# Patient Record
Sex: Female | Born: 1965 | Race: Black or African American | Hispanic: No | Smoking: Never smoker
Health system: Southern US, Community
[De-identification: ages and names within clinical notes are randomized; demographics above are authoritative.]

## PROBLEM LIST (undated history)

## (undated) DIAGNOSIS — O009 Unspecified ectopic pregnancy without intrauterine pregnancy: Secondary | ICD-10-CM

## (undated) DIAGNOSIS — D219 Benign neoplasm of connective and other soft tissue, unspecified: Secondary | ICD-10-CM

## (undated) DIAGNOSIS — D6800 Von Willebrand disease, unspecified: Secondary | ICD-10-CM

## (undated) DIAGNOSIS — N6011 Diffuse cystic mastopathy of right breast: Secondary | ICD-10-CM

## (undated) DIAGNOSIS — N6012 Diffuse cystic mastopathy of left breast: Secondary | ICD-10-CM

## (undated) DIAGNOSIS — Z8742 Personal history of other diseases of the female genital tract: Secondary | ICD-10-CM

## (undated) HISTORY — PX: ECTOPIC PREGNANCY SURGERY: SHX613

## (undated) HISTORY — DX: Diffuse cystic mastopathy of right breast: N60.11

## (undated) HISTORY — DX: Diffuse cystic mastopathy of left breast: N60.12

## (undated) HISTORY — PX: OVARIAN CYST REMOVAL: SHX89

## (undated) HISTORY — PX: COLPOSCOPY: SHX161

## (undated) HISTORY — DX: Personal history of other diseases of the female genital tract: Z87.42

## (undated) HISTORY — DX: Unspecified ectopic pregnancy without intrauterine pregnancy: O00.90

## (undated) HISTORY — DX: Benign neoplasm of connective and other soft tissue, unspecified: D21.9

---

## 1992-05-20 HISTORY — PX: SALPINGECTOMY: SHX328

## 1995-01-24 HISTORY — PX: CERVICAL BIOPSY  W/ LOOP ELECTRODE EXCISION: SUR135

## 1999-02-25 HISTORY — PX: OOPHORECTOMY: SHX86

## 2005-09-24 ENCOUNTER — Ambulatory Visit: Payer: Self-pay | Admitting: Unknown Physician Specialty

## 2006-12-17 ENCOUNTER — Ambulatory Visit: Payer: Self-pay | Admitting: Unknown Physician Specialty

## 2008-01-30 ENCOUNTER — Ambulatory Visit: Payer: Self-pay | Admitting: Unknown Physician Specialty

## 2009-01-31 ENCOUNTER — Ambulatory Visit: Payer: Self-pay | Admitting: Unknown Physician Specialty

## 2009-02-18 ENCOUNTER — Ambulatory Visit: Payer: Self-pay | Admitting: Unknown Physician Specialty

## 2009-08-06 ENCOUNTER — Emergency Department: Payer: Self-pay | Admitting: Emergency Medicine

## 2009-12-22 ENCOUNTER — Ambulatory Visit: Payer: Self-pay

## 2010-04-17 ENCOUNTER — Ambulatory Visit: Payer: Self-pay | Admitting: Unknown Physician Specialty

## 2011-04-21 ENCOUNTER — Ambulatory Visit: Payer: Self-pay | Admitting: Unknown Physician Specialty

## 2014-03-21 ENCOUNTER — Ambulatory Visit: Payer: Self-pay

## 2014-06-10 ENCOUNTER — Emergency Department: Payer: Self-pay | Admitting: Emergency Medicine

## 2014-06-10 LAB — WET PREP, GENITAL

## 2015-09-17 ENCOUNTER — Emergency Department
Admission: EM | Admit: 2015-09-17 | Discharge: 2015-09-17 | Disposition: A | Discharge status: Home / Self Care | Payer: Self-pay | Attending: Emergency Medicine | Admitting: Emergency Medicine

## 2015-09-17 ENCOUNTER — Encounter: Payer: Self-pay | Admitting: Emergency Medicine

## 2015-09-17 DIAGNOSIS — N3001 Acute cystitis with hematuria: Secondary | ICD-10-CM | POA: Insufficient documentation

## 2015-09-17 DIAGNOSIS — Z3202 Encounter for pregnancy test, result negative: Secondary | ICD-10-CM | POA: Insufficient documentation

## 2015-09-17 HISTORY — DX: Von Willebrand disease, unspecified: D68.00

## 2015-09-17 HISTORY — DX: Von Willebrand's disease: D68.0

## 2015-09-17 LAB — POCT PREGNANCY, URINE: PREG TEST UR: NEGATIVE

## 2015-09-17 LAB — URINALYSIS COMPLETE WITH MICROSCOPIC (ARMC ONLY)
BILIRUBIN URINE: NEGATIVE
Bacteria, UA: NONE SEEN
Glucose, UA: NEGATIVE mg/dL
KETONES UR: NEGATIVE mg/dL
Nitrite: NEGATIVE
PH: 6 (ref 5.0–8.0)
PROTEIN: NEGATIVE mg/dL
Specific Gravity, Urine: 1.02 (ref 1.005–1.030)

## 2015-09-17 LAB — PREGNANCY, URINE: Preg Test, Ur: NEGATIVE

## 2015-09-17 MED ORDER — CIPROFLOXACIN HCL 500 MG PO TABS: 500.0000 mg | ORAL_TABLET | Freq: Two times a day (BID) | ORAL | Status: AC

## 2015-09-17 MED ORDER — FLUCONAZOLE 150 MG PO TABS: 150.0000 mg | ORAL_TABLET | Freq: Every day | ORAL | Status: DC

## 2015-09-17 NOTE — Discharge Instructions (Signed)

## 2015-09-17 NOTE — ED Provider Notes (Signed)
CSN: [id number]     Arrival date & time 09/17/15  1429 History   First MD Initiated Contact with Patient 09/17/15 1443     Chief Complaint  Patient presents with  . Urinary Frequency    HPI Comments: 49 year old female presents today complaining of urinary pressure and frequency. She has already finished a course of macrobid and septra for the same complaint. Seen at urgent care twice, finished last antibiotic approximately a week ago. Continues to have urinary frequency, no burning lower abdominal or back pain. Has had some intermittent vaginal itching, no discharge increased from baseline. LNMP was 1 month ago, sexually active had admits to missed contraceptive doses.    Past Medical History  Diagnosis Date  . Von Willebrand disease ([place])    History reviewed. No pertinent past surgical history. No family history on file. Social History  Substance Use Topics  . Smoking status: Never Smoker   . Smokeless tobacco: None  . Alcohol Use: Yes   OB History    No data available     Review of Systems  Constitutional: Negative for fever and chills.  Gastrointestinal: Negative for abdominal pain.  Endocrine: Positive for polyuria.  Genitourinary: Positive for vaginal discharge. Negative for dysuria, decreased urine volume, difficulty urinating and pelvic pain.  Skin: Negative for rash.  All other systems reviewed and are negative.     Allergies  Review of patient's allergies indicates no known allergies.  Home Medications   Prior to Admission medications   Medication Sig Start Date End Date Taking? Authorizing Provider  fluconazole (DIFLUCAN) 150 MG tablet Take 1 tablet (150 mg total) by mouth daily. 09/17/15   Pierce Crane Beers, PA-C   BP 154/73 mmHg  Pulse 83  Temp(Src) 98.1 F (36.7 C) (Oral)  Resp 18  Ht 5\' 5"  (1.651 m)  Wt 136 lb (61.689 kg)  BMI 22.63 kg/m2  SpO2 98%  LMP 08/22/2015 (Exact Date) Physical Exam  Constitutional: She is oriented to person, place, and  time. Vital signs are normal. She appears well-developed and well-nourished. She is active.  Non-toxic appearance. She does not have a sickly appearance. She does not appear ill.  HENT:  Head: Normocephalic and atraumatic.  Cardiovascular: Normal rate, regular rhythm, normal heart sounds and intact distal pulses.  Exam reveals no gallop and no friction rub.   No murmur heard. Pulmonary/Chest: Effort normal and breath sounds normal. No respiratory distress. She has no wheezes. She has no rales.  Abdominal: Soft. Bowel sounds are normal. She exhibits no distension. There is no tenderness. There is no rebound and no guarding.  No CVA tenderness.   Musculoskeletal: Normal range of motion.  Neurological: She is alert and oriented to person, place, and time.  Skin: Skin is warm and dry.  Psychiatric: She has a normal mood and affect. Her behavior is normal. Judgment and thought content normal.  Nursing note and vitals reviewed.   ED Course  Procedures (including critical care time) Labs Review Labs Reviewed  URINALYSIS COMPLETEWITH MICROSCOPIC ([place]) - Abnormal; Notable for the following:    Color, Urine YELLOW (*)    APPearance CLEAR (*)    Hgb urine dipstick 1+ (*)    Leukocytes, UA 2+ (*)    Squamous Epithelial / LPF 6-30 (*)    All other components within normal limits  URINE CULTURE  PREGNANCY, URINE  POCT PREGNANCY, URINE    Imaging Review No results found. I have personally reviewed and evaluated these images and  lab results as part of my medical decision-making.   EKG Interpretation None      MDM  UA pending at this time - will turn over care to [facility], PA-C. Plan is to treat with abx or refer back to PCP for OAB  Final diagnoses:  Acute cystitis with hematuria        Corliss Parish, PA-C 09/28/15 1018  Eula Listen, MD 10/01/15 1456

## 2015-09-17 NOTE — ED Notes (Signed)
States she just finished 2 rounds of antibiotics for uti  Still feels urinary pressure  With some freq.

## 2015-09-17 NOTE — ED Notes (Signed)
States she was placed on macrobid and then septra ds for UTI .finished both of those meds. Now having some slight vaginal itching  And some pressure with urination

## 2015-09-17 NOTE — ED Provider Notes (Signed)
  Physical Exam  BP 154/73 mmHg  Pulse 83  Temp(Src) 98.1 F (36.7 C) (Oral)  Resp 18  Ht 5\' 5"  (1.651 m)  Wt 136 lb (61.689 kg)  BMI 22.63 kg/m2  SpO2 98%  LMP 08/22/2015 (Exact Date)  Physical Exam  ED Course  Procedures  MDM 2 urinary tract infection. Rx given for Cipro 500 mg twice a day #20. Patient follow-up with PCP or return to the emergency room for reevaluation. Rx given for Diflucan 150 by mouth 1 with an ovarian antibiotic course.  Patient voices no other emergency medical complaints at this time.      Arlyss Repress, PA-C 09/17/15 [place], MD 09/17/15 2051

## 2015-09-19 LAB — URINE CULTURE: Special Requests: NORMAL

## 2015-10-01 ENCOUNTER — Encounter: Payer: Self-pay | Admitting: Emergency Medicine

## 2015-10-01 ENCOUNTER — Emergency Department
Admission: EM | Admit: 2015-10-01 | Discharge: 2015-10-01 | Disposition: A | Discharge status: Home / Self Care | Payer: Self-pay | Attending: Emergency Medicine | Admitting: Emergency Medicine

## 2015-10-01 DIAGNOSIS — Z79899 Other long term (current) drug therapy: Secondary | ICD-10-CM | POA: Insufficient documentation

## 2015-10-01 DIAGNOSIS — N3090 Cystitis, unspecified without hematuria: Secondary | ICD-10-CM | POA: Insufficient documentation

## 2015-10-01 LAB — URINALYSIS COMPLETE WITH MICROSCOPIC (ARMC ONLY)
BACTERIA UA: NONE SEEN
Bilirubin Urine: NEGATIVE
GLUCOSE, UA: NEGATIVE mg/dL
HGB URINE DIPSTICK: NEGATIVE
Nitrite: NEGATIVE
Protein, ur: NEGATIVE mg/dL
SPECIFIC GRAVITY, URINE: 1.025 (ref 1.005–1.030)
pH: 6 (ref 5.0–8.0)

## 2015-10-01 NOTE — ED Provider Notes (Signed)
[facility] Emergency Department Provider Note  ____________________________________________  Time seen: On arrival  I have reviewed the triage vital signs and the nursing notes.   HISTORY  Chief Complaint Urinary Frequency    HPI Erin Davidson is a 49 y.o. female who presents with complaints of urinary frequency/pressure. She notes this started yesterday. She reports she has been treated twice in the past month for urinary tract infection and the symptoms were similar and she did feel better for sometime but now symptoms returned. She is frustrated and does not understand why this happened. No history of urinary tract infections prior to this. No vaginal discharge    Past Medical History  Diagnosis Date  . Von Willebrand disease ([place])     There are no active problems to display for this patient.   Past Surgical History  Procedure Laterality Date  . Ectopic pregnancy surgery    . Ovarian cyst surgery      Current Outpatient Rx  Name  Route  Sig  Dispense  Refill  . fluconazole (DIFLUCAN) 150 MG tablet   Oral   Take 1 tablet (150 mg total) by mouth daily.   1 tablet   0     Allergies Aspirin  No family history on file.  Social History Social History  Substance Use Topics  . Smoking status: Never Smoker   . Smokeless tobacco: None  . Alcohol Use: Yes    Review of Systems  Constitutional: Negative for fever. Eyes: Negative for discharge ENT: Negative for sore throat Genitourinary: Negative for dysuria. Positive for pressure Musculoskeletal: Negative for back pain. Skin: Negative for rash.    ____________________________________________   PHYSICAL EXAM:  VITAL SIGNS: ED Triage Vitals  Enc Vitals Group     BP 10/01/15 1150 130/77 mmHg     Pulse Rate 10/01/15 1150 71     Resp 10/01/15 1150 16     Temp 10/01/15 1150 97.9 F (36.6 C)     Temp Source 10/01/15 1150 Oral     SpO2 10/01/15 1150 100 %     Weight 10/01/15  1150 136 lb (61.689 kg)     Height 10/01/15 1150 5\' 5"  (1.651 m)     Head Cir --      Peak Flow --      Pain Score 10/01/15 1151 2     Pain Loc --      Pain Edu? --      Excl. in [place]? --      Constitutional: Alert and oriented. Well appearing and in no distress. Eyes: Conjunctivae are normal.  ENT   Head: Normocephalic and atraumatic.   Mouth/Throat: Mucous membranes are moist. Cardiovascular: Normal rate, regular rhythm.  Respiratory: Normal respiratory effort without tachypnea nor retractions.  Gastrointestinal: Soft and non-tender in all quadrants. No distention. There is no CVA tenderness. Musculoskeletal: Nontender with normal range of motion in all extremities. Neurologic:  Normal speech and language. No gross focal neurologic deficits are appreciated. Skin:  Skin is warm, dry and intact. No rash noted. Psychiatric: Mood and affect are normal. Patient exhibits appropriate insight and judgment.  ____________________________________________    LABS (pertinent positives/negatives)  Labs Reviewed  URINALYSIS COMPLETEWITH MICROSCOPIC ([facility] ONLY) - Abnormal; Notable for the following:    Color, Urine YELLOW (*)    APPearance CLEAR (*)    Ketones, ur TRACE (*)    Leukocytes, UA TRACE (*)    Squamous Epithelial / LPF 0-5 (*)    All other  components within normal limits  URINE CULTURE    ____________________________________________     ____________________________________________    RADIOLOGY I have personally reviewed any xrays that were ordered on this patient: None  ____________________________________________   PROCEDURES  Procedure(s) performed: none   ____________________________________________   INITIAL IMPRESSION / ASSESSMENT AND PLAN / ED COURSE  Pertinent labs & imaging results that were available during my care of the patient were reviewed by me and considered in my medical decision making (see chart for details).  Urinalysis is  unremarkable. Patient does report a history of fibroids and I'm suspicious that they have grown to a size where they are affecting her bladder. I will send a urine culture but will not treat with antibiotics at this time. The patient will follow-up with her gynecologist as soon as possible to evaluate for enlargement of fibroids  ____________________________________________   FINAL CLINICAL IMPRESSION(S) / ED DIAGNOSES  Final diagnoses:  Cystitis     Lavonia Drafts, MD 10/01/15 1408

## 2015-10-01 NOTE — ED Notes (Signed)
Pt reports urinary frequency and left lower back/flank pain since yesterday. Pt reports she just finished a course of antibiotics with no relief from symptoms.

## 2015-10-05 LAB — URINE CULTURE

## 2015-10-06 ENCOUNTER — Telehealth: Payer: Self-pay | Admitting: Pharmacist

## 2015-10-06 NOTE — Telephone Encounter (Signed)
Pt called and informed that culture was postive for UTI and offered to call in RX to pharmacy. Pt requested [facility] in [place]. Pen VK 500mg  po bid for 10 days was called in. Authorized by Dr. Conni Slipper. Message left on provider VM as pharmacy was closed.   Ramond Dial, Pharm.D Clinical Pharmacist

## 2016-03-16 ENCOUNTER — Other Ambulatory Visit: Payer: Self-pay | Admitting: Certified Nurse Midwife

## 2016-03-16 DIAGNOSIS — N63 Unspecified lump in unspecified breast: Secondary | ICD-10-CM

## 2016-03-30 ENCOUNTER — Ambulatory Visit
Admission: RE | Admit: 2016-03-30 | Discharge: 2016-03-30 | Disposition: A | Discharge status: Home / Self Care | Payer: 59 | Source: Ambulatory Visit | Attending: Certified Nurse Midwife | Admitting: Certified Nurse Midwife

## 2016-03-30 DIAGNOSIS — N63 Unspecified lump in unspecified breast: Secondary | ICD-10-CM

## 2017-03-17 ENCOUNTER — Encounter: Payer: Self-pay | Admitting: Certified Nurse Midwife

## 2017-03-17 ENCOUNTER — Ambulatory Visit (INDEPENDENT_AMBULATORY_CARE_PROVIDER_SITE_OTHER): Payer: 59 | Admitting: Certified Nurse Midwife

## 2017-03-17 VITALS — BP 120/80 | HR 80 | Ht 65.0 in | Wt 144.0 lb

## 2017-03-17 DIAGNOSIS — D251 Intramural leiomyoma of uterus: Secondary | ICD-10-CM | POA: Diagnosis not present

## 2017-03-17 DIAGNOSIS — Z1231 Encounter for screening mammogram for malignant neoplasm of breast: Secondary | ICD-10-CM

## 2017-03-17 DIAGNOSIS — N6310 Unspecified lump in the right breast, unspecified quadrant: Secondary | ICD-10-CM

## 2017-03-17 DIAGNOSIS — Z1239 Encounter for other screening for malignant neoplasm of breast: Secondary | ICD-10-CM

## 2017-03-17 DIAGNOSIS — N6320 Unspecified lump in the left breast, unspecified quadrant: Secondary | ICD-10-CM | POA: Diagnosis not present

## 2017-03-17 DIAGNOSIS — Z01419 Encounter for gynecological examination (general) (routine) without abnormal findings: Secondary | ICD-10-CM | POA: Diagnosis not present

## 2017-03-17 DIAGNOSIS — Z124 Encounter for screening for malignant neoplasm of cervix: Secondary | ICD-10-CM | POA: Diagnosis not present

## 2017-03-17 MED ORDER — JUNEL FE 1/20 1-20 MG-MCG PO TABS: 1.0000 | ORAL_TABLET | Freq: Every day | ORAL | 11 refills | Status: DC

## 2017-03-20 ENCOUNTER — Encounter: Payer: Self-pay | Admitting: Certified Nurse Midwife

## 2017-03-20 NOTE — Progress Notes (Addendum)
Gynecology Annual Exam  PCP: No PCP Per Patient  Chief Complaint:  Chief Complaint  Patient presents with  . Gynecologic Exam    History of Present Illness: Patient is a 51 y.o. [id number] BF, LMP 03/03/2017, who presents for annual exam. The patient has no complaints today.  Her menses are monthly on her birth control pills. They last 7 days and are a light flow. Her past medical history is remarkable for a large fibroid uterus and Von Willebrand disease. She has had two ectopic pregnancies and has had a partial left salpingectmy and a left oophorectomy. She also has a remote history of abnormal Pap smears and has had a LEEP in 1996. Her last Pap smear in 03/16/16 was NIL/ neg HRHPV. The patient is sexually active. She currently uses Oral contraceptives: Junel 1/20 for contraception.   The patient does perform self breast exams. She has a history of fibrocystic breasts and her last mammogram was diagnostic for palpable bilateral breast masses. It revealed two cysts: one at 10 o'clock in the right breast and another at 5 o'clock in the left breast. She is still able to palpate the same mass in the right breast. There is no notable family history of breast or ovarian cancer in her family. THe patient does not smoke. She drinks alcohol occasionally. She denies use of illicit street drugs.  The patient has regular exercise: yes.  She walks 1 mile 4x/week. She gets adequate calcium in her diet. Her routine screen fro cholesterol was normal in 2017. Marland Kitchen    Review of Systems: Review of Systems  Constitutional: Negative for chills, fever and weight loss.  HENT: Negative for congestion, sinus pain and sore throat.   Eyes: Negative for blurred vision and pain.  Respiratory: Negative for hemoptysis, shortness of breath and wheezing.   Cardiovascular: Negative for chest pain, palpitations and leg swelling.  Gastrointestinal: Negative for abdominal pain, blood in stool, diarrhea, heartburn, nausea and  vomiting.  Genitourinary: Negative for dysuria, frequency, hematuria and urgency.  Musculoskeletal: Negative for back pain, joint pain and myalgias.  Skin: Negative for itching and rash.  Neurological: Negative for dizziness, tingling and headaches.  Endo/Heme/Allergies: Negative for environmental allergies and polydipsia. Does not bruise/bleed easily.       Negative for hirsutism   Psychiatric/Behavioral: Negative for depression. The patient is not nervous/anxious and does not have insomnia.   Breasts: positive for breast mass in right breast  Past Medical History:  Past Medical History:  Diagnosis Date  . Ectopic pregnancy 05/20/92; 02/25/99  . Fibrocystic breast changes of both breasts   . Fibroid   . History of abnormal cervical Pap smear    HGSIL  . Von Willebrand disease ([place])     Past Surgical History:  Past Surgical History:  Procedure Laterality Date  . CERVICAL BIOPSY  W/ LOOP ELECTRODE EXCISION  01/24/1995   HSIL  . COLPOSCOPY    . ECTOPIC PREGNANCY SURGERY    . OOPHORECTOMY  02/25/1999   left ovary/tube preop ruptured ectopic  . OVARIAN CYST REMOVAL Left   . SALPINGECTOMY  05/20/1992   left for partial ruptured ectopic    Gynecologic History:  Patient's last menstrual period was 03/03/2017 (exact date).  Family History:  Family History  Problem Relation Age of Onset  . Hyperlipidemia Mother   . Kidney cancer Mother 83  . Hyperlipidemia Maternal Aunt   . Lung cancer Maternal Grandfather   . Lung cancer Paternal Grandfather  Social History:  Social History   Social History  . Marital status: Divorced    Spouse name: N/A  . Number of children: 2  . Years of education: N/A   Occupational History  . Lab asisstant    Social History Main Topics  . Smoking status: Never Smoker  . Smokeless tobacco: Never Used  . Alcohol use Yes     Comment: occ  . Drug use: No  . Sexual activity: Yes    Birth control/ protection: Pill   Other Topics Concern    . Not on file   Social History Narrative  . No narrative on file    Allergies:  Allergies  Allergen Reactions  . Aspirin     See medical hx    Medications: Prior to Admission medications   Medication Sig Start Date End Date Taking? Authorizing Provider  JUNEL FE 1/20 1-20 MG-MCG tablet Take 1 tablet by mouth daily. 03/17/17   Dalia Heading, CNM  Vitacrave Gummy daily  Physical Exam Vitals: Blood pressure 120/80, pulse 80, height 5\' 5"  (1.651 m), weight 144 lb (65.3 kg),BMI 23.96kg/m2,  last menstrual period 03/03/2017.  General: Bwell nourished, well developed, BF, in NAD HEENT: normocephalic, anicteric Thyroid: no enlargement, no palpable nodules Pulmonary: No increased work of breathing, CTAB Cardiovascular: RRR,without murmur Breast: Breast symmetrical, no tenderness, no skin or nipple retraction present, no nipple discharge. In right breast at 10 o'clock is a 2 cm round, firm mass. In the left breast at 5 o'clock is a 1 cm, round, more subtle mass.  No axillary, infraclavicular, or supraclavicular lymphadenopathy. Abdomen:  soft, non-tender. Mass (uterine fundus just below the umbilicus) Umbilicus without lesions  No hepatomegaly.  No evidence of hernia  Genitourinary:  External: Normal external female genitalia.  Normal urethral meatus, normal Bartholin's and Skene's glands.    Vagina: Normal vaginal mucosa, no evidence of prolapse.    Cervix: Grossly normal in appearance, no bleeding  Uterus: enlarged, not mobile, palpated fundus just below her umbilicus, NT  Adnexa: unable to evaluate due to large uterus  Rectal: deferred  Lymphatic: no evidence of inguinal lymphadenopathy Extremities: no edema, erythema, or tenderness Neurologic: Grossly intact Psychiatric: mood appropriate, affect full     Assessment: 51 y.o. [id number] well woman exam Asymptomatic large fibroid uterus Bilateral breast masses- same areas where breast cysts were noted in last years diagnostic  mammogram   Plan: 1) Mammogram - will order diagnostic bilateral mammogram and ultrasound bilaterally  2) Pap done  3) Contraception - Refill of Junel 1/20 e-prescribed  4) Routine healthcare maintenance including cholesterol, diabetes screening  done at work   5) Large fibroid- stable in size, and basically asymptomatic  Dalia Heading, CNM After referral made to [facility] for diagnostic mammogram, they recommended doing screening mammogram since current masses probably the same as last year. Patient called and advised of same and she concurs with screening mammogram first with the risk of being called back for further views/ ultrasound if needed.  Dalia Heading, CNM

## 2017-03-22 LAB — IGP,RFX APTIMA HPV ALL PTH: PAP Smear Comment: 0

## 2017-03-22 NOTE — Addendum Note (Signed)
Addended by: Dalia Heading on: 03/22/2017 03:26 PM   Modules accepted: Orders

## 2017-04-09 ENCOUNTER — Ambulatory Visit
Admission: RE | Admit: 2017-04-09 | Discharge: 2017-04-09 | Disposition: A | Discharge status: Home / Self Care | Payer: 59 | Source: Ambulatory Visit | Attending: Certified Nurse Midwife | Admitting: Certified Nurse Midwife

## 2017-04-09 DIAGNOSIS — N6310 Unspecified lump in the right breast, unspecified quadrant: Secondary | ICD-10-CM

## 2017-04-09 DIAGNOSIS — Z1239 Encounter for other screening for malignant neoplasm of breast: Secondary | ICD-10-CM

## 2017-04-09 DIAGNOSIS — Z1231 Encounter for screening mammogram for malignant neoplasm of breast: Secondary | ICD-10-CM | POA: Diagnosis present

## 2017-04-09 DIAGNOSIS — R928 Other abnormal and inconclusive findings on diagnostic imaging of breast: Secondary | ICD-10-CM | POA: Diagnosis not present

## 2017-04-09 DIAGNOSIS — N6320 Unspecified lump in the left breast, unspecified quadrant: Secondary | ICD-10-CM

## 2017-04-12 ENCOUNTER — Other Ambulatory Visit: Payer: Self-pay | Admitting: Certified Nurse Midwife

## 2017-04-12 DIAGNOSIS — N6310 Unspecified lump in the right breast, unspecified quadrant: Secondary | ICD-10-CM

## 2017-04-12 DIAGNOSIS — R928 Other abnormal and inconclusive findings on diagnostic imaging of breast: Secondary | ICD-10-CM

## 2017-04-16 ENCOUNTER — Telehealth: Payer: Self-pay | Admitting: Certified Nurse Midwife

## 2017-04-16 NOTE — Telephone Encounter (Signed)
Patient with history of fibrocystic breasts called with mammogram results-needs additional views on the right breast for possible mass.. Will order and [place] will contact for appt

## 2017-04-23 ENCOUNTER — Ambulatory Visit
Admission: RE | Admit: 2017-04-23 | Discharge: 2017-04-23 | Disposition: A | Discharge status: Home / Self Care | Payer: 59 | Source: Ambulatory Visit | Attending: Certified Nurse Midwife | Admitting: Certified Nurse Midwife

## 2017-04-23 DIAGNOSIS — N6310 Unspecified lump in the right breast, unspecified quadrant: Secondary | ICD-10-CM

## 2017-04-23 DIAGNOSIS — N6001 Solitary cyst of right breast: Secondary | ICD-10-CM | POA: Insufficient documentation

## 2017-04-23 DIAGNOSIS — R928 Other abnormal and inconclusive findings on diagnostic imaging of breast: Secondary | ICD-10-CM

## 2018-01-12 IMAGING — US US BREAST*R* LIMITED INC AXILLA
1 series · 6 of 6 positions shown · non-contrast
Comparison: Previous exam(s).

CLINICAL DATA: Possible mass in the far posterior aspect of the
upper right breast on a recent 2D screening mammogram.

EXAM:
2D DIGITAL DIAGNOSTIC RIGHT MAMMOGRAM WITH CAD AND ADJUNCT TOMO
ULTRASOUND RIGHT BREAST

[Series 1: us breast*right* limited inc axilla · 0.06mm/px · 6 of 6 slices shown]
[im 1/6]
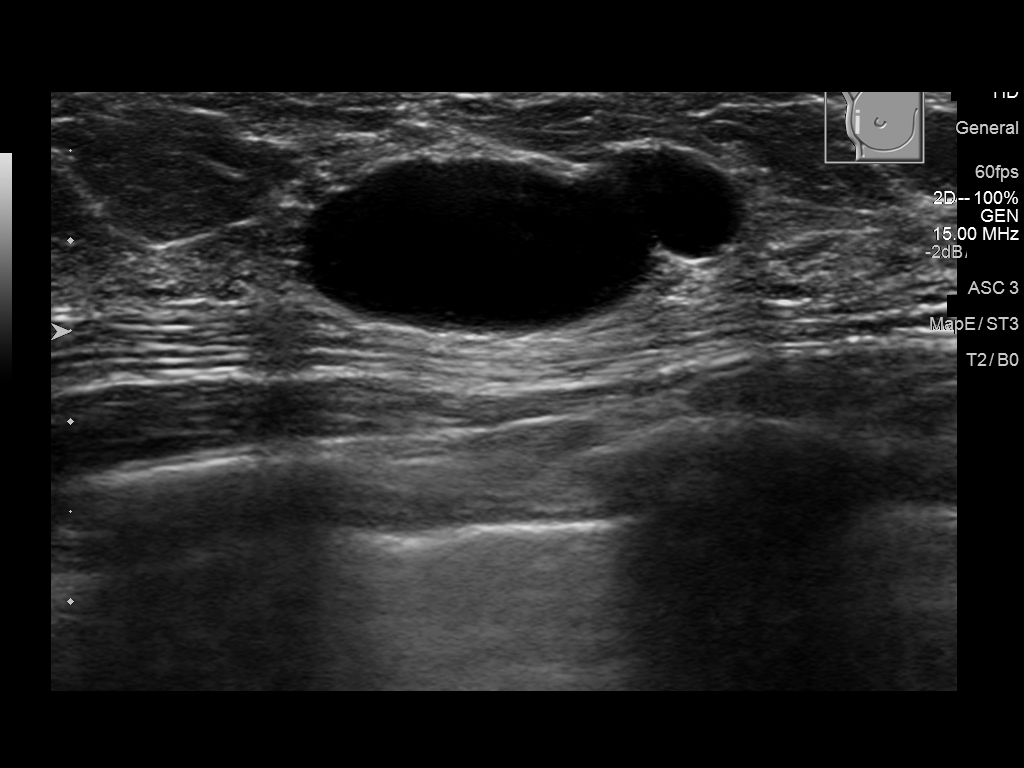
[im 2/6]
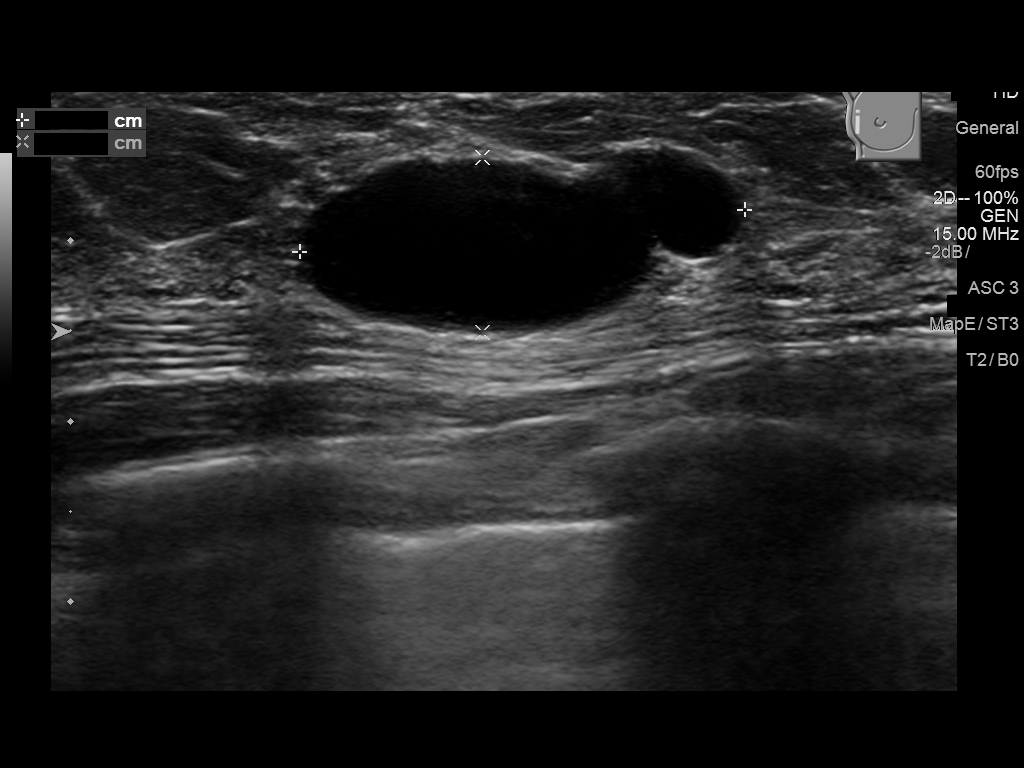
[im 3/6]
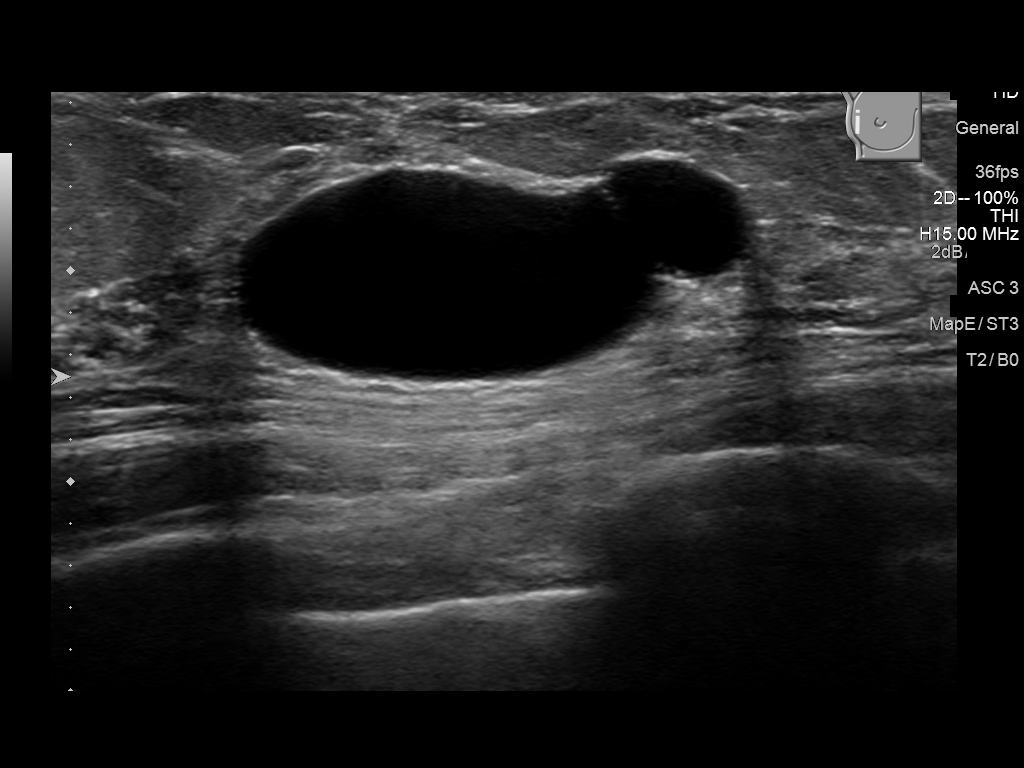
[im 4/6]
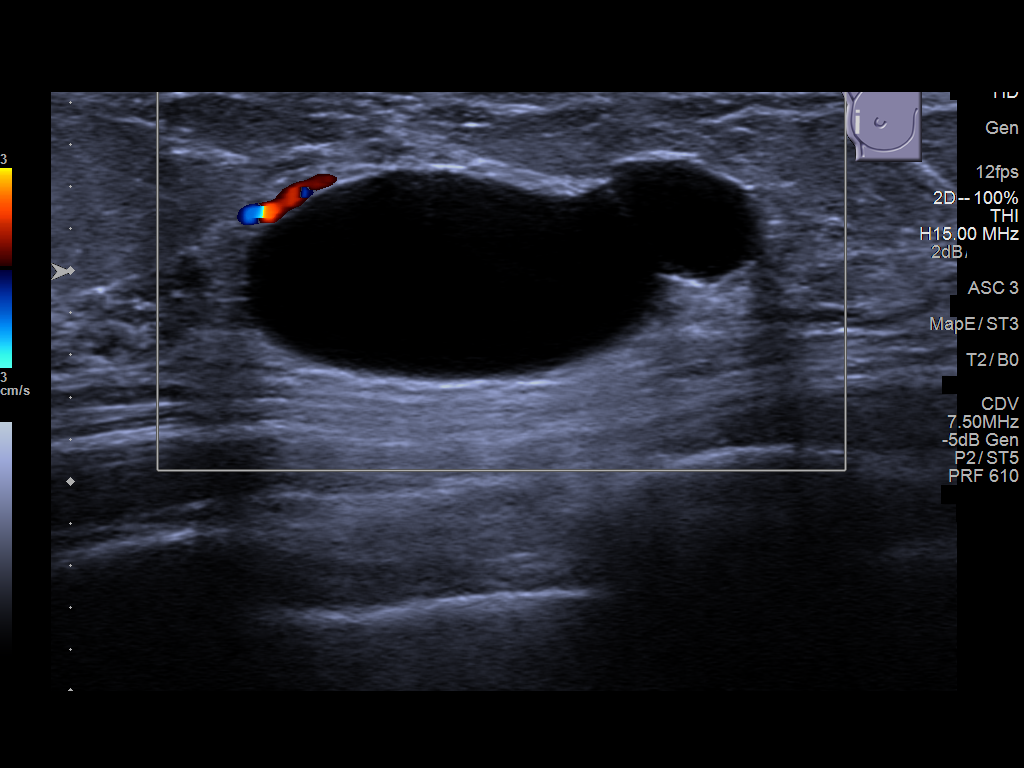
[im 5/6]
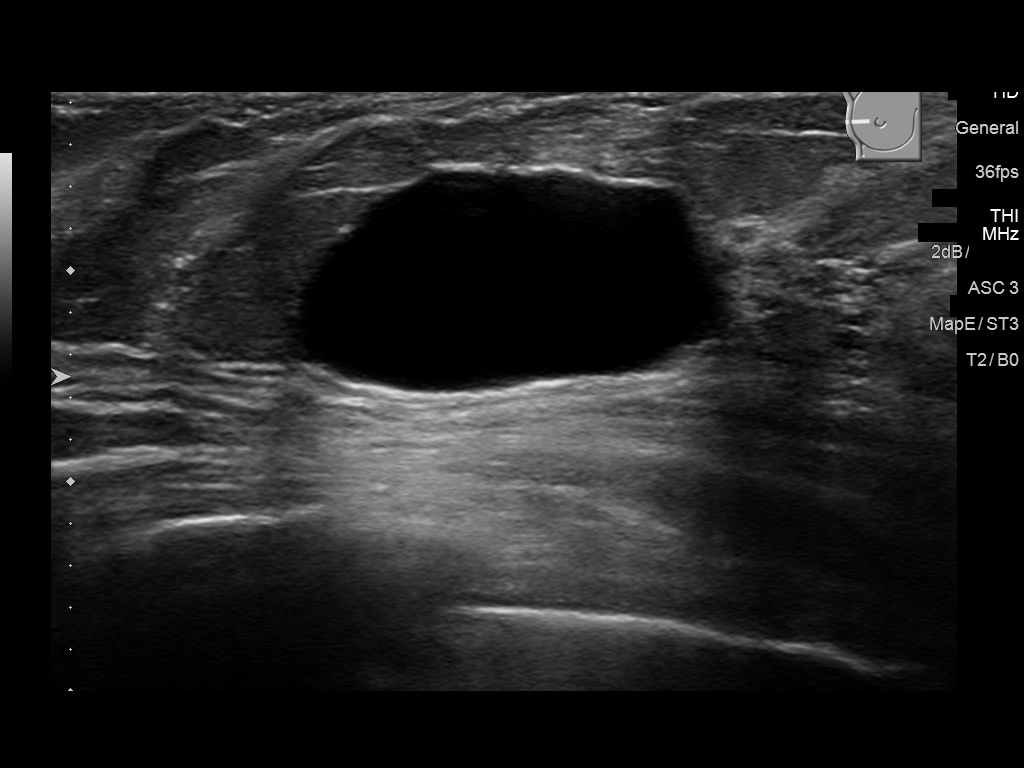
[im 6/6]
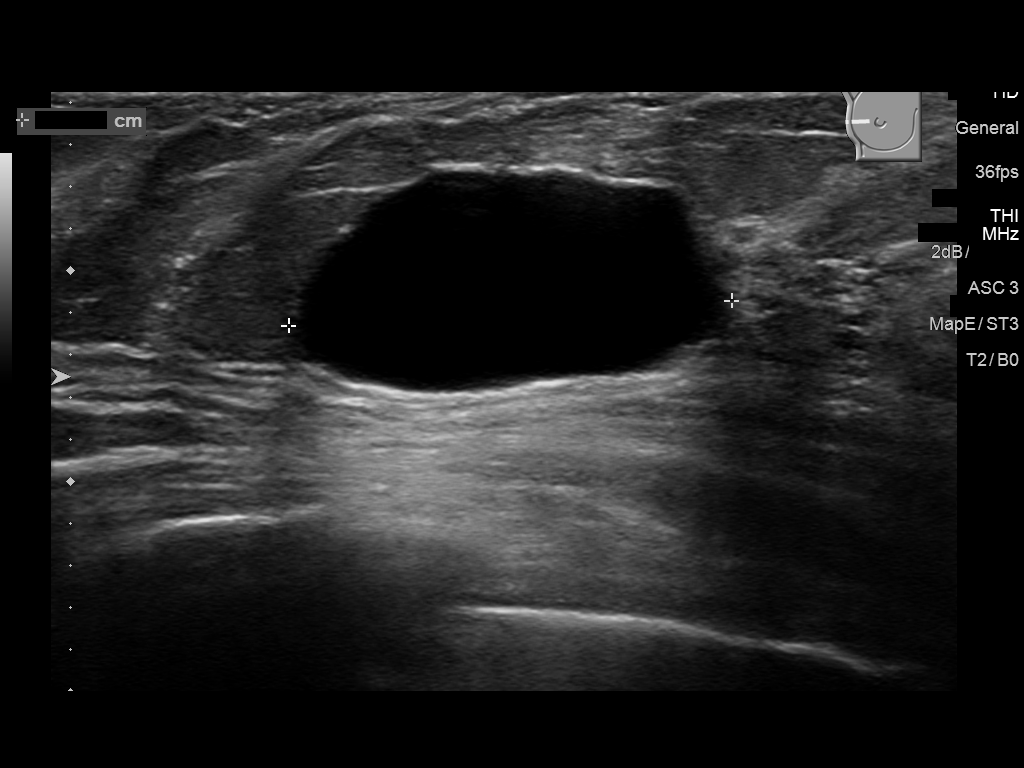

[6 of 6 positions shown; findings below may reference images not displayed]

ACR Breast Density Category d: The breast tissue is extremely dense,
which lowers the sensitivity of mammography.
FINDINGS: 2D and 3D tomographic images of the right breast confirm a 2 cm
rounded, circumscribed and partially obscured mass deep in the
superior aspect of the right breast in the oblique projection. This
is far laterally located on laterally exaggerated craniocaudal
views.

Mammographic images were processed with CAD.

On physical exam, no mass is palpable in the outer right breast in
the area of mammographic concern.

Targeted ultrasound is performed, showing a 2.5 cm cyst containing a
thin partial internal septation in the 9 o'clock position of the
right breast, 7 cm from the nipple, corresponding to the
mammographic mass. Additional smaller cysts were seen more medially
in that portion of the breast at real-time.
IMPRESSION: The recently suspected right breast mass is a benign cyst. No
evidence of malignancy.

RECOMMENDATION:
Bilateral screening mammogram in 1 year.

I have discussed the findings and recommendations with the patient.
Results were also provided in writing at the conclusion of the
visit. If applicable, a reminder letter will be sent to the patient
regarding the next appointment.

BI-RADS CATEGORY  2: Benign.

## 2018-02-15 IMAGING — MG MM DIGITAL DIAGNOSTIC UNILAT*R* W/ TOMO W/ CAD
8 of 10 series · 8 of 18 positions shown · non-contrast
Comparison: Previous exam(s).

CLINICAL DATA: Possible mass in the far posterior aspect of the
upper right breast on a recent 2D screening mammogram.

EXAM:
2D DIGITAL DIAGNOSTIC RIGHT MAMMOGRAM WITH CAD AND ADJUNCT TOMO
ULTRASOUND RIGHT BREAST

[R MLO (1 of 3)]
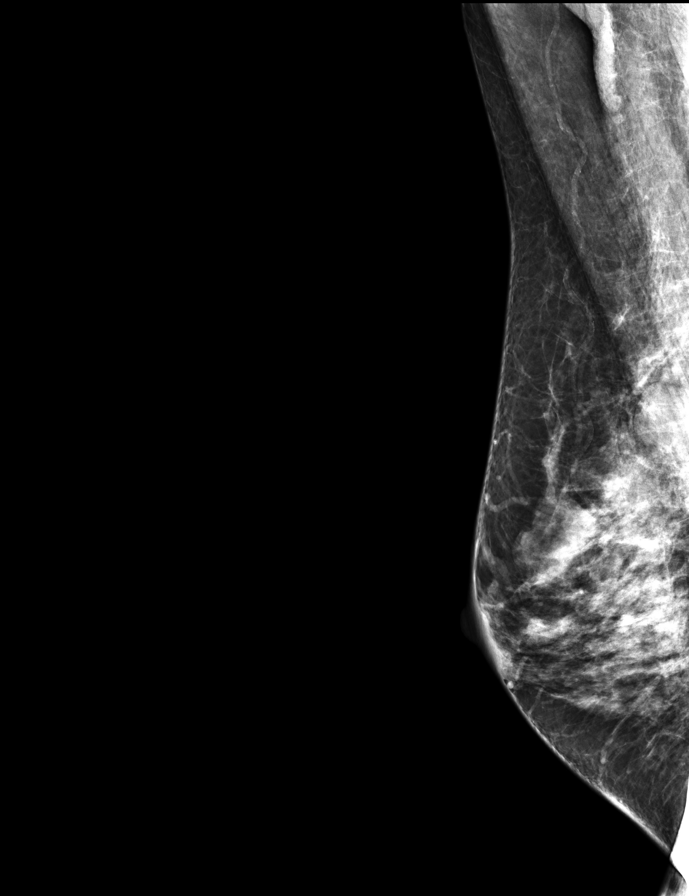

[R MLO synth-2D]
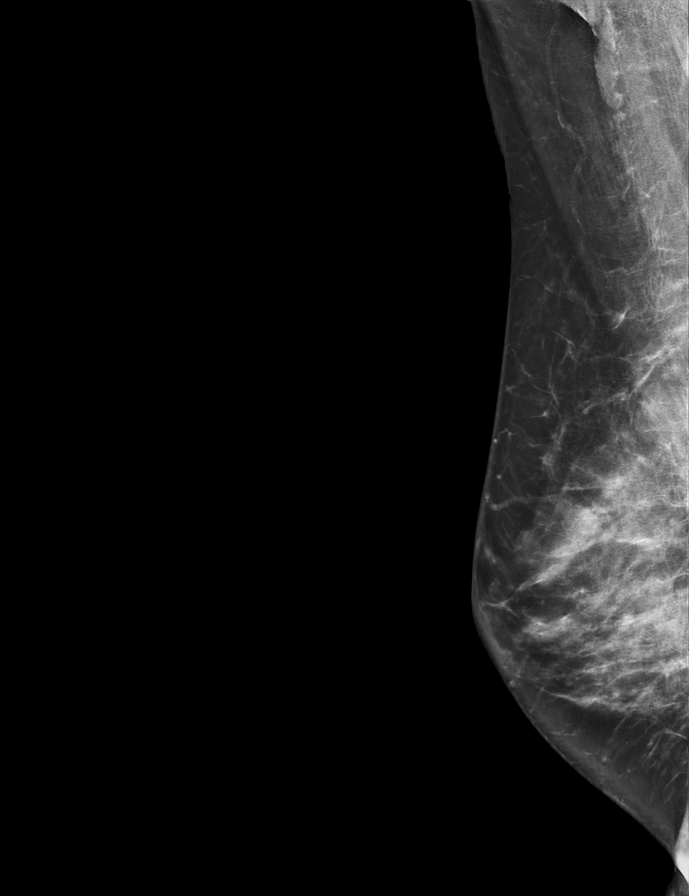

[R XCCL synth-2D]
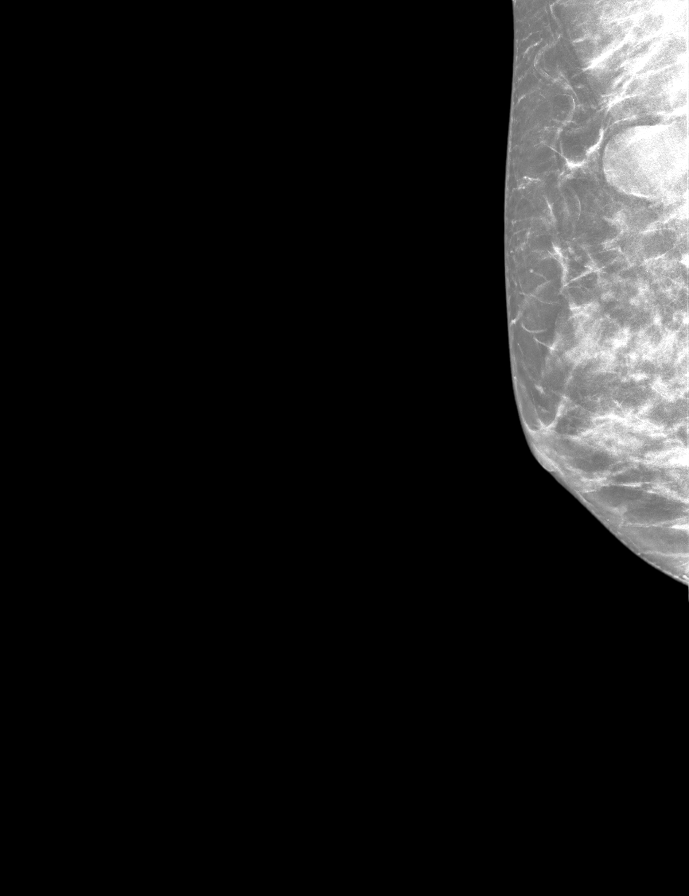

[R XCCL (1 of 3)]
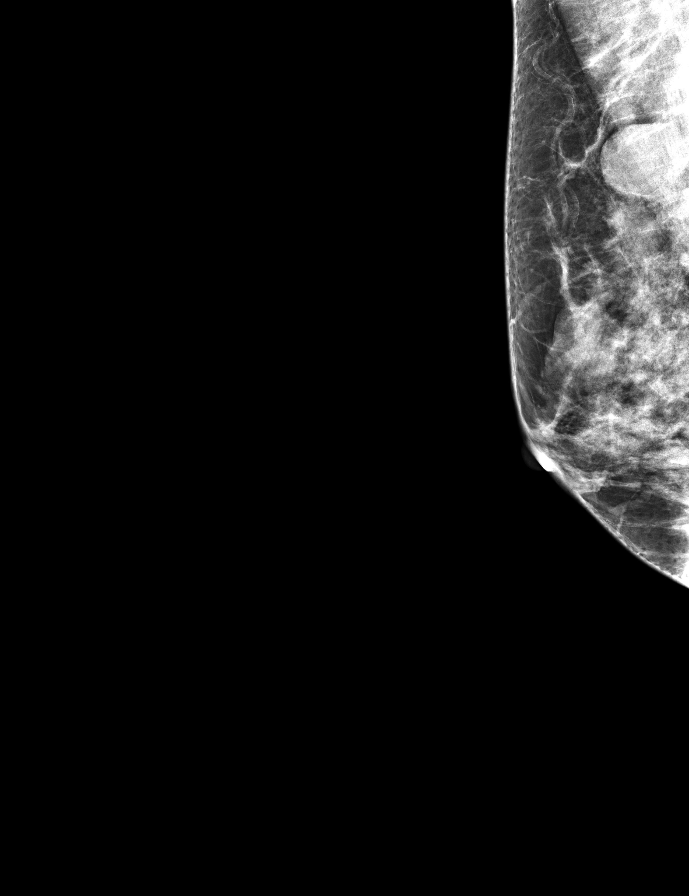

[R MLO (2 of 3)]
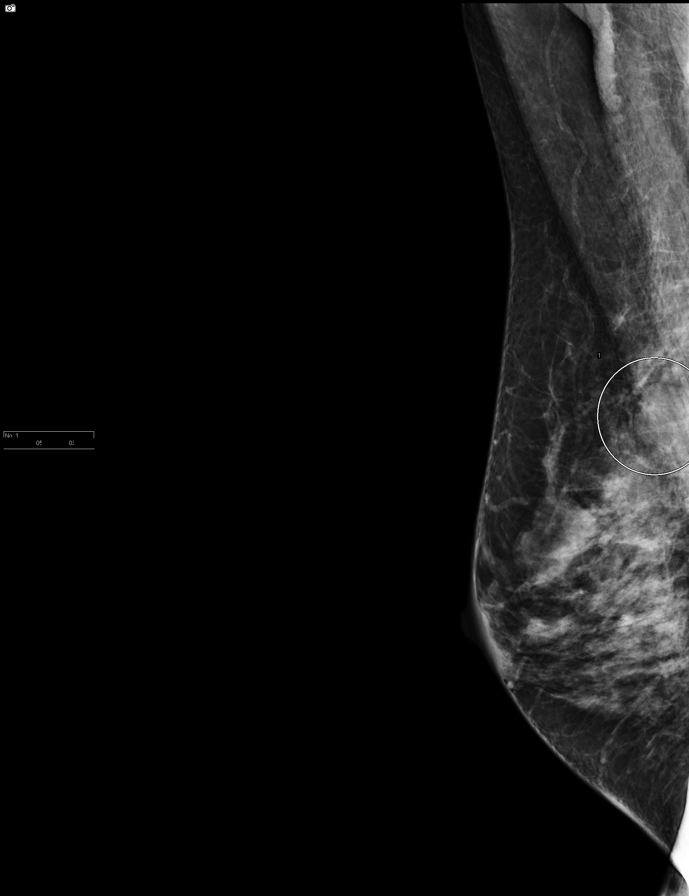

[R XCCL (2 of 3)]
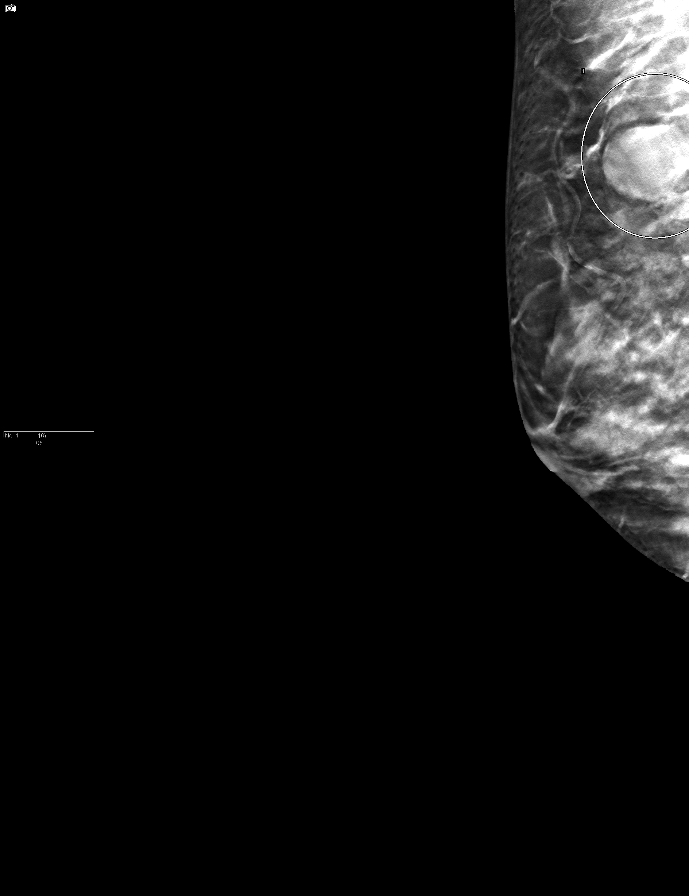

[R XCCL (3 of 3)]
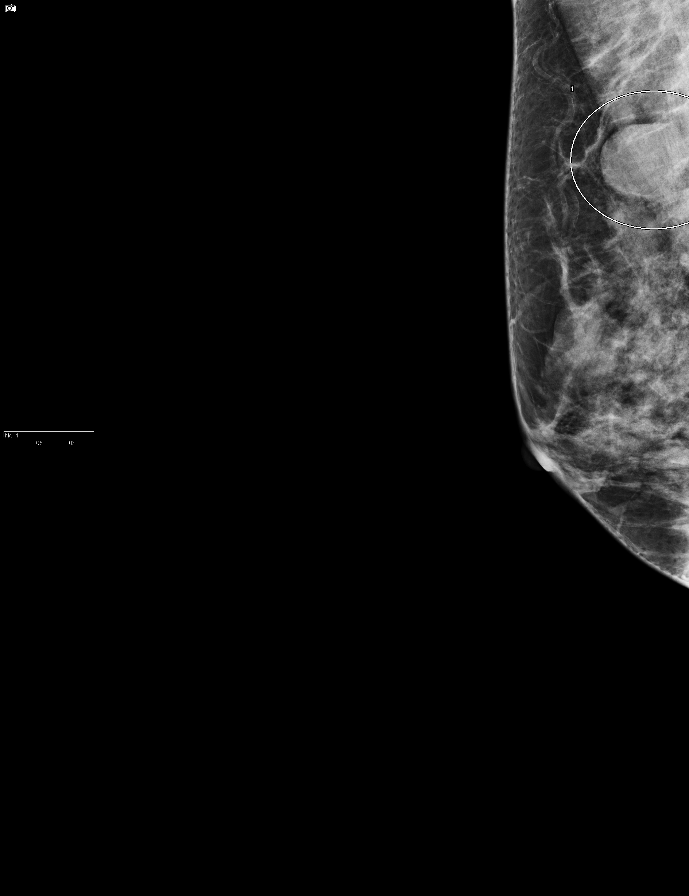

[R MLO (3 of 3)]
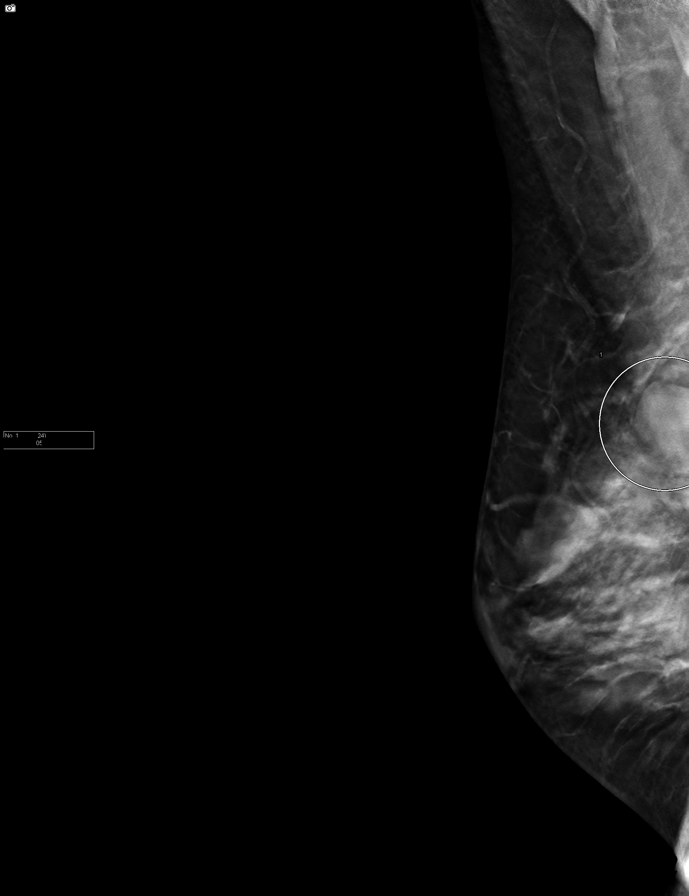

[8 of 18 positions shown; findings below may reference images not displayed]

ACR Breast Density Category d: The breast tissue is extremely dense,
which lowers the sensitivity of mammography.
FINDINGS: 2D and 3D tomographic images of the right breast confirm a 2 cm
rounded, circumscribed and partially obscured mass deep in the
superior aspect of the right breast in the oblique projection. This
is far laterally located on laterally exaggerated craniocaudal
views.

Mammographic images were processed with CAD.

On physical exam, no mass is palpable in the outer right breast in
the area of mammographic concern.

Targeted ultrasound is performed, showing a 2.5 cm cyst containing a
thin partial internal septation in the 9 o'clock position of the
right breast, 7 cm from the nipple, corresponding to the
mammographic mass. Additional smaller cysts were seen more medially
in that portion of the breast at real-time.
IMPRESSION: The recently suspected right breast mass is a benign cyst. No
evidence of malignancy.

RECOMMENDATION:
Bilateral screening mammogram in 1 year.

I have discussed the findings and recommendations with the patient.
Results were also provided in writing at the conclusion of the
visit. If applicable, a reminder letter will be sent to the patient
regarding the next appointment.

BI-RADS CATEGORY  2: Benign.

## 2018-03-02 ENCOUNTER — Telehealth: Payer: Self-pay

## 2018-03-02 MED ORDER — JUNEL FE 1/20 1-20 MG-MCG PO TABS: 1.0000 | ORAL_TABLET | Freq: Every day | ORAL | 0 refills | Status: DC

## 2018-03-02 NOTE — Telephone Encounter (Signed)
1 rf sent. Pt aware. 

## 2018-03-02 NOTE — Telephone Encounter (Signed)
Pt made apt for AE 03/25/18. She will need 1 additional rf to get her to her apt. [facility] [place]. [phone]

## 2018-03-04 ENCOUNTER — Other Ambulatory Visit: Payer: Self-pay | Admitting: Certified Nurse Midwife

## 2018-03-12 ENCOUNTER — Other Ambulatory Visit: Payer: Self-pay

## 2018-03-12 ENCOUNTER — Emergency Department
Admission: EM | Admit: 2018-03-12 | Discharge: 2018-03-12 | Disposition: A | Discharge status: Home / Self Care | Payer: 59 | Attending: Emergency Medicine | Admitting: Emergency Medicine

## 2018-03-12 DIAGNOSIS — R197 Diarrhea, unspecified: Secondary | ICD-10-CM | POA: Diagnosis present

## 2018-03-12 DIAGNOSIS — K529 Noninfective gastroenteritis and colitis, unspecified: Secondary | ICD-10-CM | POA: Diagnosis not present

## 2018-03-12 LAB — COMPREHENSIVE METABOLIC PANEL
ALT: 18 U/L (ref 14–54)
ANION GAP: 9 (ref 5–15)
AST: 27 U/L (ref 15–41)
Albumin: 4.2 g/dL (ref 3.5–5.0)
Alkaline Phosphatase: 57 U/L (ref 38–126)
BILIRUBIN TOTAL: 0.6 mg/dL (ref 0.3–1.2)
BUN: 6 mg/dL (ref 6–20)
CO2: 22 mmol/L (ref 22–32)
Calcium: 9 mg/dL (ref 8.9–10.3)
Chloride: 102 mmol/L (ref 101–111)
Creatinine, Ser: 0.5 mg/dL (ref 0.44–1.00)
GFR calc Af Amer: >60 mL/min (ref >60)
Glucose, Bld: 98 mg/dL (ref 65–99)
POTASSIUM: 3.2 mmol/L — AB (ref 3.5–5.1)
Sodium: 133 mmol/L — ABNORMAL LOW (ref 135–145)
TOTAL PROTEIN: 7.9 g/dL (ref 6.5–8.1)

## 2018-03-12 LAB — URINALYSIS, COMPLETE (UACMP) WITH MICROSCOPIC
BILIRUBIN URINE: NEGATIVE
GLUCOSE, UA: NEGATIVE mg/dL
Ketones, ur: 5 mg/dL — AB
NITRITE: NEGATIVE
PH: 6 (ref 5.0–8.0)
Protein, ur: NEGATIVE mg/dL
Specific Gravity, Urine: 1.013 (ref 1.005–1.030)

## 2018-03-12 LAB — LIPASE, BLOOD: Lipase: 28 U/L (ref 11–51)

## 2018-03-12 LAB — CBC
HEMATOCRIT: 43.2 % (ref 35.0–47.0)
HEMOGLOBIN: 14.6 g/dL (ref 12.0–16.0)
MCH: 30.6 pg (ref 26.0–34.0)
MCHC: 33.9 g/dL (ref 32.0–36.0)
MCV: 90.4 fL (ref 80.0–100.0)
Platelets: 244 10*3/uL (ref 150–440)
RBC: 4.78 MIL/uL (ref 3.80–5.20)
RDW: 13 % (ref 11.5–14.5)
WBC: 10.9 10*3/uL (ref 3.6–11.0)

## 2018-03-12 LAB — POCT PREGNANCY, URINE: PREG TEST UR: NEGATIVE

## 2018-03-12 MED ORDER — ONDANSETRON 4 MG PO TBDP
4.0000 mg | ORAL_TABLET | Freq: Once | ORAL | Status: AC | PRN
  Administered 2018-03-12: 4 mg via ORAL
  Filled 2018-03-12: qty 1

## 2018-03-12 MED ORDER — ONDANSETRON 4 MG PO TBDP: 4.0000 mg | ORAL_TABLET | Freq: Three times a day (TID) | ORAL | 0 refills | Status: DC | PRN

## 2018-03-12 MED ORDER — ONDANSETRON 4 MG PO TBDP
4.0000 mg | ORAL_TABLET | Freq: Once | ORAL | Status: AC
  Administered 2018-03-12: 4 mg via ORAL
  Filled 2018-03-12: qty 1

## 2018-03-12 NOTE — ED Triage Notes (Signed)
Pt states last night went out to eat ([employer]- roast beef, turnip greens, pinto beans, and hash brown casserole). On the way home started with abd pain. When she got home N&V&D began. Occurred all night. Alert, oriented, ambulatory.

## 2018-03-12 NOTE — ED Provider Notes (Signed)
[facility] Emergency Department Provider Note   ____________________________________________    I have reviewed the triage vital signs and the nursing notes.   HISTORY  Chief Complaint Emesis and Diarrhea     HPI Erin Davidson is a 52 y.o. female who presents with complaints of nausea vomiting and diarrhea which started yesterday after eating at the [employer].  Patient reports she started to feel nauseated with diffuse abdominal cramping.  She had multiple episodes of diarrhea overnight with continued vomiting.  Nonbilious nonbloody.  No sick contacts reported.  No recent travel.  Has not taken anything for this.  No fevers or chills or myalgias.   Past Medical History:  Diagnosis Date  . Ectopic pregnancy 05/20/92; 02/25/99  . Fibrocystic breast changes of both breasts   . Fibroid   . History of abnormal cervical Pap smear    HGSIL  . Von Willebrand disease ([place])     There are no active problems to display for this patient.   Past Surgical History:  Procedure Laterality Date  . CERVICAL BIOPSY  W/ LOOP ELECTRODE EXCISION  01/24/1995   HSIL  . COLPOSCOPY    . ECTOPIC PREGNANCY SURGERY    . OOPHORECTOMY  02/25/1999   left ovary/tube preop ruptured ectopic  . OVARIAN CYST REMOVAL Left   . SALPINGECTOMY  05/20/1992   left for partial ruptured ectopic    Prior to Admission medications   Medication Sig Start Date End Date Taking? Authorizing Provider  JUNEL FE 1/20 1-20 MG-MCG tablet Take 1 tablet by mouth daily. 03/02/18   Dalia Heading, CNM  ondansetron (ZOFRAN ODT) 4 MG disintegrating tablet Take 1 tablet (4 mg total) by mouth every 8 (eight) hours as needed for nausea or vomiting. 03/12/18   Lavonia Drafts, MD     Allergies Aspirin  Family History  Problem Relation Age of Onset  . Hyperlipidemia Mother   . Kidney cancer Mother 77  . Hyperlipidemia Maternal Aunt   . Lung cancer Maternal Grandfather   . Lung cancer  Paternal Grandfather     Social History Social History   Tobacco Use  . Smoking status: Never Smoker  . Smokeless tobacco: Never Used  Substance Use Topics  . Alcohol use: Yes    Comment: occ  . Drug use: No    Review of Systems  Constitutional: No fever/chills Eyes: No visual changes.  ENT: No sore throat. Cardiovascular: Denies chest pain. Respiratory: Denies shortness of breath. Gastrointestinal: As above Genitourinary: Negative for dysuria. Musculoskeletal: Negative for back pain. Skin: Negative for rash. Neurological: Negative for headaches   ____________________________________________   PHYSICAL EXAM:  VITAL SIGNS: ED Triage Vitals [03/12/18 1504]  Enc Vitals Group     BP (!) 148/77     Pulse Rate 88     Resp 18     Temp 97.8 F (36.6 C)     Temp Source Oral     SpO2 100 %     Weight 65.8 kg (145 lb)     Height 1.651 m (5\' 5" )     Head Circumference      Peak Flow      Pain Score 8     Pain Loc      Pain Edu?      Excl. in [place]?     Constitutional: Alert and oriented. No acute distress. Pleasant and interactive Eyes: Conjunctivae are normal.   Nose: No congestion/rhinnorhea. Mouth/Throat: Mucous membranes are moist.  Cardiovascular: Normal rate, regular rhythm. Grossly normal heart sounds.  Good peripheral circulation. Respiratory: Normal respiratory effort.  No retractions. Lungs CTAB. Gastrointestinal: Soft and nontender. No distention.  No CVA tenderness. Genitourinary: deferred Musculoskeletal: No lower extremity tenderness nor edema.  Warm and well perfused Neurologic:  Normal speech and language. No gross focal neurologic deficits are appreciated.  Skin:  Skin is warm, dry and intact. No rash noted. Psychiatric: Mood and affect are normal. Speech and behavior are normal.  ____________________________________________   LABS (all labs ordered are listed, but only abnormal results are displayed)  Labs Reviewed  COMPREHENSIVE  METABOLIC PANEL - Abnormal; Notable for the following components:      Result Value   Sodium 133 (*)    Potassium 3.2 (*)    All other components within normal limits  URINALYSIS, COMPLETE (UACMP) WITH MICROSCOPIC - Abnormal; Notable for the following components:   Color, Urine YELLOW (*)    APPearance HAZY (*)    Hgb urine dipstick MODERATE (*)    Ketones, ur 5 (*)    Leukocytes, UA SMALL (*)    Bacteria, UA RARE (*)    Squamous Epithelial / LPF 6-30 (*)    All other components within normal limits  LIPASE, BLOOD  CBC  POC URINE PREG, ED  POCT PREGNANCY, URINE   ____________________________________________  EKG  None ____________________________________________  RADIOLOGY  None ____________________________________________   PROCEDURES  Procedure(s) performed: No  Procedures   Critical Care performed: No ____________________________________________   INITIAL IMPRESSION / ASSESSMENT AND PLAN / ED COURSE  Pertinent labs & imaging results that were available during my care of the patient were reviewed by me and considered in my medical decision making (see chart for details).  Patient well-appearing in no acute distress, reassuring exam no abdominal tenderness.  Vitals unremarkable.  No evidence of significant dehydration.  Symptoms most consistent with viral gastroenteritis although food borne illness is also a possibility.  Recommend supportive care, ODT Zofran, outpatient follow-up as needed.  Return precautions discussed    ____________________________________________   FINAL CLINICAL IMPRESSION(S) / ED DIAGNOSES  Final diagnoses:  Gastroenteritis        Note:  This document was prepared using Dragon voice recognition software and may include unintentional dictation errors.    Lavonia Drafts, MD 03/12/18 [phone]

## 2018-03-12 NOTE — ED Notes (Signed)
Pt discharged to home.  Family member driving.  Discharge instructions reviewed.  Verbalized understanding.  No questions or concerns at this time.  Teach back verified.  Pt in NAD.  No items left in ED.   

## 2018-03-12 NOTE — ED Triage Notes (Signed)
First nurse:  Baldwin [place] out last night.  Started having vomiting when she got home and it has continued.

## 2018-03-25 ENCOUNTER — Ambulatory Visit (INDEPENDENT_AMBULATORY_CARE_PROVIDER_SITE_OTHER): Payer: 59 | Admitting: Certified Nurse Midwife

## 2018-03-25 ENCOUNTER — Encounter: Payer: Self-pay | Admitting: Certified Nurse Midwife

## 2018-03-25 VITALS — BP 122/78 | HR 68 | Ht 65.0 in | Wt 144.0 lb

## 2018-03-25 DIAGNOSIS — Z1231 Encounter for screening mammogram for malignant neoplasm of breast: Secondary | ICD-10-CM | POA: Diagnosis not present

## 2018-03-25 DIAGNOSIS — Z1211 Encounter for screening for malignant neoplasm of colon: Secondary | ICD-10-CM | POA: Diagnosis not present

## 2018-03-25 DIAGNOSIS — Z01419 Encounter for gynecological examination (general) (routine) without abnormal findings: Secondary | ICD-10-CM

## 2018-03-25 DIAGNOSIS — D219 Benign neoplasm of connective and other soft tissue, unspecified: Secondary | ICD-10-CM | POA: Diagnosis not present

## 2018-03-25 DIAGNOSIS — Z1239 Encounter for other screening for malignant neoplasm of breast: Secondary | ICD-10-CM

## 2018-03-25 MED ORDER — JUNEL FE 1/20 1-20 MG-MCG PO TABS: 1.0000 | ORAL_TABLET | Freq: Every day | ORAL | 3 refills | Status: DC

## 2018-03-25 NOTE — Progress Notes (Signed)
Gynecology Annual Exam  PCP: Patient, No Pcp Per  Chief Complaint:  Chief Complaint  Patient presents with  . Gynecologic Exam    History of Present Illness: Patient is a 52 y.o. [id number] BF, LMP 03/03/2018, who presents for  An annual exam. The patient states that her menses have become infrequent on the Junel 1/20 in the last 6 months. Her last withdrawal bleed was just spotting for  a few days and her LPMP was 5 months ago and was also just spotting. Prior to that her menses were monthly and lasted 7 days. Denies hot flashes. She reports having food poisoning 2 weeks ago, but her nausea, vomiting and diarrhea has now resolved.  Her past medical history is remarkable for a large fibroid uterus and Von Willebrand disease. She has had two ectopic pregnancies and has had a partial left salpingectmy and a left oophorectomy. She also has a remote history of abnormal Pap smears and has had a LEEP in 1996. Her last Pap smear in 03/17/2017 was NIL. The patient is sexually active. She currently uses Oral contraceptives: Junel 1/20 for contraception.   The patient does perform occasional self breast exams. She has a history of fibrocystic breasts and her last mammogram was diagnostic 04/23/2018.  It revealed a 2.5cm right breast cyst at 10 o'clock, which was also seen on prior mammograms. She has not been able to palpate the same mass in the right breast. There is no notable family history of breast or ovarian cancer in her family. The patient does not smoke. She drinks alcohol occasionally. She denies use of illicit street drugs. The patient has regular exercise: yes, she walks 3 miles at work on most days   She gets adequate calcium in her diet. Her routine screen for cholesterol was normal in 2018 at work ([facility]). .    Review of Systems: Review of Systems  Constitutional: Negative for chills, fever and weight loss.  HENT: Negative for congestion, sinus pain and sore throat.   Eyes: Negative for  blurred vision and pain.  Respiratory: Negative for hemoptysis, shortness of breath and wheezing.   Cardiovascular: Negative for chest pain, palpitations and leg swelling.  Gastrointestinal: Positive for diarrhea, nausea and vomiting. Negative for abdominal pain, blood in stool and heartburn.  Genitourinary: Negative for dysuria, frequency, hematuria and urgency.  Musculoskeletal: Negative for back pain, joint pain and myalgias.  Skin: Negative for itching and rash.  Neurological: Negative for dizziness, tingling and headaches.  Endo/Heme/Allergies: Negative for environmental allergies and polydipsia. Does not bruise/bleed easily.       Negative for hirsutism   Psychiatric/Behavioral: Negative for depression. The patient is not nervous/anxious and does not have insomnia.   Breasts: positive for breast mass in right breast  Past Medical History:  Past Medical History:  Diagnosis Date  . Ectopic pregnancy 05/20/92; 02/25/99  . Fibrocystic breast changes of both breasts   . Fibroid   . History of abnormal cervical Pap smear    HGSIL  . Von Willebrand disease ([place])     Past Surgical History:  Past Surgical History:  Procedure Laterality Date  . CERVICAL BIOPSY  W/ LOOP ELECTRODE EXCISION  01/24/1995   HSIL  . COLPOSCOPY    . ECTOPIC PREGNANCY SURGERY    . OOPHORECTOMY  02/25/1999   left ovary/tube preop ruptured ectopic  . OVARIAN CYST REMOVAL Left   . SALPINGECTOMY  05/20/1992   left for partial ruptured ectopic    Gynecologic History:  No LMP recorded. (Menstrual status: Perimenopausal).  Family History:  Family History  Problem Relation Age of Onset  . Hyperlipidemia Mother   . Hyperlipidemia Maternal Aunt        all four maternal aunts  . Lung cancer Maternal Grandfather   . Lung cancer Paternal Grandfather   . Kidney cancer Maternal Grandmother 28  . Diabetes Neg Hx   . Heart disease Neg Hx   . Hypertension Neg Hx     Social History:  Social History    Socioeconomic History  . Marital status: Divorced    Spouse name: Not on file  . Number of children: 2  . Years of education: Not on file  . Highest education level: Not on file  Occupational History  . Occupation: Mining engineer  Social Needs  . Financial resource strain: Not on file  . Food insecurity:    Worry: Not on file    Inability: Not on file  . Transportation needs:    Medical: Not on file    Non-medical: Not on file  Tobacco Use  . Smoking status: Never Smoker  . Smokeless tobacco: Never Used  Substance and Sexual Activity  . Alcohol use: Yes    Comment: occ  . Drug use: No  . Sexual activity: Yes    Birth control/protection: Pill  Lifestyle  . Physical activity:    Days per week: 0 days    Minutes per session: 0 min  . Stress: Not at all  Relationships  . Social connections:    Talks on phone: Not on file    Gets together: Not on file    Attends religious service: Not on file    Active member of club or organization: Not on file    Attends meetings of clubs or organizations: Not on file    Relationship status: Not on file  . Intimate partner violence:    Fear of current or ex partner: Not on file    Emotionally abused: Not on file    Physically abused: Not on file    Forced sexual activity: Not on file  Other Topics Concern  . Not on file  Social History Narrative  . Not on file    Allergies:  Allergies  Allergen Reactions  . Aspirin     See medical hx    Medications:  Current Outpatient Medications:  .  JUNEL FE 1/20 1-20 MG-MCG tablet, Take 1 tablet by mouth daily., Disp: 3 Package, Rfl: 3 .  Multiple Vitamins-Calcium (ONE-A-DAY WOMENS FORMULA PO), Take 1 tablet by mouth daily., Disp: , Rfl:    Physical Exam Vitals: BP 122/78   Pulse 68   Ht '5\' 5"'$  (1.651 m)   Wt 144 lb (65.3 kg)   BMI 23.96 kg/m   General: well nourished, well developed, BF, in NAD HEENT: normocephalic, anicteric Thyroid: no enlargement, no palpable  nodules Pulmonary: No increased work of breathing, CTAB Cardiovascular: RRR,without murmur Breast: Breast symmetrical, no tenderness, no skin or nipple retraction present, no nipple discharge. In right breast at 10 o'clock is a 2-3 cm round mass. No axillary, infraclavicular, or supraclavicular lymphadenopathy. Abdomen:  soft, non-tender. Mass (uterine fundus just below the umbilicus) Umbilicus without lesions  No hepatomegaly.  No evidence of hernia  Genitourinary:  External: Normal external female genitalia.  Normal urethral meatus, normal Bartholin's and Skene's glands.    Vagina: Normal vaginal mucosa, no evidence of prolapse.    Cervix: Grossly normal in appearance, no bleeding  Uterus: enlarged,  not mobile, palpated fundus just below her umbilicus, NT  Adnexa: unable to evaluate due to large uterus  Rectal: deferred  Lymphatic: no evidence of inguinal lymphadenopathy Extremities: no edema, erythema, or tenderness Neurologic: Grossly intact Psychiatric: mood appropriate, affect full     Assessment: 52 y.o. [id number] well woman exam Asymptomatic large fibroid uterus Right breast mass- same area where breast cyst was noted in last year's diagnostic mammogram   Plan: 1)Breast cancer screening: screening mammogram ordered. Patient to schedule after 04/23/2018  2) Pap not done. Desires Pap smears every 3 years  3) Contraception - Refill of Junel 1/20 e-prescribed. Discussed getting an FSH/LH after she has not had a menses in 1 year  4) Routine healthcare maintenance including cholesterol, diabetes screening  done at work   5) Large fibroid-will get pelvic ultrasound to look for change in size and to evaluate right adenexa. Follow up after ultrasound  6) Colon cancer screen: Explained options: annual stool check for occult blood, colonoscopy, Cologuard. Desires annual stool for occult blood. Home collection kit given to her with instructions.  Dalia Heading, CNM

## 2018-03-26 ENCOUNTER — Encounter: Payer: Self-pay | Admitting: Certified Nurse Midwife

## 2018-04-08 ENCOUNTER — Ambulatory Visit: Payer: 59 | Admitting: Certified Nurse Midwife

## 2018-04-08 ENCOUNTER — Ambulatory Visit (INDEPENDENT_AMBULATORY_CARE_PROVIDER_SITE_OTHER): Payer: 59

## 2018-04-08 DIAGNOSIS — D219 Benign neoplasm of connective and other soft tissue, unspecified: Secondary | ICD-10-CM

## 2019-02-27 ENCOUNTER — Other Ambulatory Visit: Payer: Self-pay

## 2019-02-27 MED ORDER — JUNEL FE 1/20 1-20 MG-MCG PO TABS: 1.0000 | ORAL_TABLET | Freq: Every day | ORAL | 3 refills | Status: DC

## 2019-05-02 ENCOUNTER — Encounter: Payer: Self-pay | Admitting: Certified Nurse Midwife

## 2019-05-02 ENCOUNTER — Ambulatory Visit (INDEPENDENT_AMBULATORY_CARE_PROVIDER_SITE_OTHER): Payer: Managed Care, Other (non HMO) | Admitting: Certified Nurse Midwife

## 2019-05-02 ENCOUNTER — Other Ambulatory Visit: Payer: Self-pay

## 2019-05-02 VITALS — BP 110/70 | Ht 65.0 in | Wt 147.0 lb

## 2019-05-02 DIAGNOSIS — Z1239 Encounter for other screening for malignant neoplasm of breast: Secondary | ICD-10-CM

## 2019-05-02 DIAGNOSIS — D251 Intramural leiomyoma of uterus: Secondary | ICD-10-CM

## 2019-05-02 DIAGNOSIS — D6800 Von Willebrand disease, unspecified: Secondary | ICD-10-CM | POA: Insufficient documentation

## 2019-05-02 DIAGNOSIS — Z01419 Encounter for gynecological examination (general) (routine) without abnormal findings: Secondary | ICD-10-CM | POA: Diagnosis not present

## 2019-05-02 DIAGNOSIS — D252 Subserosal leiomyoma of uterus: Secondary | ICD-10-CM

## 2019-05-02 DIAGNOSIS — Z124 Encounter for screening for malignant neoplasm of cervix: Secondary | ICD-10-CM

## 2019-05-02 DIAGNOSIS — D259 Leiomyoma of uterus, unspecified: Secondary | ICD-10-CM | POA: Insufficient documentation

## 2019-05-02 DIAGNOSIS — Z1211 Encounter for screening for malignant neoplasm of colon: Secondary | ICD-10-CM

## 2019-05-02 MED ORDER — JUNEL FE 1/20 1-20 MG-MCG PO TABS: 1.0000 | ORAL_TABLET | Freq: Every day | ORAL | 3 refills | Status: DC

## 2019-05-02 NOTE — Progress Notes (Signed)
Gynecology Annual Exam  PCP: Patient, No Pcp Per  Chief Complaint:  Chief Complaint  Patient presents with  . Gynecologic Exam    History of Present Illness: Patient is a 53 y.o. [id number] BF, LMP 04/25/2019, who presents for an annual exam. The patient states that her withdrawal bleeds on the Junel 1/20 are mostly spotting and some months she does not have a withdrawal bleed. Will also experience some bloating during her menses. Denies hot flashes or dysmenorrhea.   Her past medical history is remarkable for a large fibroid uterus and Von Willebrand disease. She has had two ectopic pregnancies and has had a partial left salpingectmy and a left oophorectomy. She also has a remote history of abnormal Pap smears and has had a LEEP in 1996. Her last Pap smear in 03/17/2017 was NIL. The patient is sexually active. She currently uses Oral contraceptives: Junel 1/20 for contraception.   The patient does perform occasional self breast exams. She has a history of fibrocystic breasts and her last mammogram was diagnostic 04/23/2017.  It revealed a 2.5cm right breast cyst at 10 o'clock, which was also seen on prior mammograms. She has not been able to palpate the same mass in the right breast. There is no notable family history of breast or ovarian cancer in her family. The patient does not smoke. She drinks wine 3x/week. She denies use of illicit street drugs. The patient has regular exercise: yes, she walks 2.5 miles at work on most days   She does not get adequate calcium in her diet. Her routine screen for cholesterol was normal in 2019 at work ([facility]). She did not do her home collection FIT test last year for colon cancer screening  Review of Systems: Review of Systems  Constitutional: Negative for chills, fever and weight loss.  HENT: Negative for congestion, sinus pain and sore throat.   Eyes: Negative for blurred vision and pain.  Respiratory: Negative for hemoptysis, shortness of breath  and wheezing.   Cardiovascular: Negative for chest pain, palpitations and leg swelling.  Gastrointestinal: Negative for abdominal pain, blood in stool, diarrhea, heartburn, nausea and vomiting.  Genitourinary: Negative for dysuria, frequency, hematuria and urgency.  Musculoskeletal: Negative for back pain, joint pain and myalgias.  Skin: Negative for itching and rash.  Neurological: Negative for dizziness, tingling and headaches.  Endo/Heme/Allergies: Negative for environmental allergies and polydipsia. Does not bruise/bleed easily.       Negative for hirsutism   Psychiatric/Behavioral: Negative for depression. The patient is not nervous/anxious and does not have insomnia.   Breasts: positive for breast mass in right breast  Past Medical History:  Past Medical History:  Diagnosis Date  . Ectopic pregnancy 05/20/92; 02/25/99  . Fibrocystic breast changes of both breasts   . Fibroid   . History of abnormal cervical Pap smear    HGSIL  . Von Willebrand disease ([place])     Past Surgical History:  Past Surgical History:  Procedure Laterality Date  . CERVICAL BIOPSY  W/ LOOP ELECTRODE EXCISION  01/24/1995   HSIL  . COLPOSCOPY    . OOPHORECTOMY  02/25/1999   left ovary/tube preop ruptured ectopic  . OVARIAN CYST REMOVAL Left   . SALPINGECTOMY  05/20/1992   left for partial ruptured ectopic    Gynecologic History:  No LMP recorded. (Menstrual status: Perimenopausal).  Family History:  Family History  Problem Relation Age of Onset  . Hyperlipidemia Mother   . Hyperlipidemia Maternal Aunt  all four maternal aunts  . Lung cancer Maternal Grandfather   . Lung cancer Paternal Grandfather   . Kidney cancer Maternal Grandmother 79  . Diabetes Neg Hx   . Heart disease Neg Hx   . Hypertension Neg Hx     Social History:  Social History   Socioeconomic History  . Marital status: Divorced    Spouse name: Not on file  . Number of children: 2  . Years of education: Not on  file  . Highest education level: Not on file  Occupational History  . Occupation: Mining engineer  Social Needs  . Financial resource strain: Not on file  . Food insecurity:    Worry: Not on file    Inability: Not on file  . Transportation needs:    Medical: Not on file    Non-medical: Not on file  Tobacco Use  . Smoking status: Never Smoker  . Smokeless tobacco: Never Used  Substance and Sexual Activity  . Alcohol use: Yes    Comment: occ  . Drug use: No  . Sexual activity: Yes    Birth control/protection: Pill  Lifestyle  . Physical activity:    Days per week: 0 days    Minutes per session: 0 min  . Stress: Not at all  Relationships  . Social connections:    Talks on phone: Not on file    Gets together: Not on file    Attends religious service: Not on file    Active member of club or organization: Not on file    Attends meetings of clubs or organizations: Not on file    Relationship status: Not on file  . Intimate partner violence:    Fear of current or ex partner: Not on file    Emotionally abused: Not on file    Physically abused: Not on file    Forced sexual activity: Not on file  Other Topics Concern  . Not on file  Social History Narrative  . Not on file    Allergies:  Allergies  Allergen Reactions  . Aspirin     See medical hx    Medications:  Current Outpatient Medications:  .  JUNEL FE 1/20 1-20 MG-MCG tablet, Take 1 tablet by mouth daily., Disp: 3 Package, Rfl: 3 .  Multiple Vitamins-Calcium (ONE-A-DAY WOMENS FORMULA PO), Take 1 tablet by mouth daily., Disp: , Rfl:  Equate severe headache and sinus prn  Physical Exam Vitals: BP 110/70   Ht 5\' 5"  (1.651 m)   Wt 147 lb (66.7 kg)   BMI 24.46 kg/m   General: well nourished, well developed, BF, in NAD HEENT: normocephalic, anicteric Thyroid: no enlargement, no palpable nodules Pulmonary: No increased work of breathing, CTAB Cardiovascular: RRR,without murmur Breast: Breast symmetrical, no  tenderness, no skin or nipple retraction present, no masses, no nipple discharge. No axillary, infraclavicular, or supraclavicular lymphadenopathy. Abdomen:  soft, non-tender. Mass (uterine fundus just below the umbilicus midline and slightly above U on the right)  No hepatomegaly.  No evidence of hernia  Genitourinary:  External: Normal external female genitalia.  Normal urethral meatus, normal Bartholin's and Skene's glands.    Vagina: Normal vaginal mucosa, no evidence of prolapse.    Cervix: extremely posterior, no bleeding  Uterus: enlarged, not mobile, palpated fundus just above her umbilicus on the right, NT  Adnexa: unable to evaluate due to large uterus  Rectal: deferred  Lymphatic: no evidence of inguinal lymphadenopathy Extremities: no edema, erythema, or tenderness Neurologic: Grossly intact Psychiatric:  mood appropriate, affect full     Assessment: 53 y.o. [id number] well woman exam Asymptomatic large fibroid uterus? Slightly larger    Plan: 1)Breast cancer screening: screening mammogram ordered. Patient to schedule at [facility]  2) Pap not done. Desires Pap smears every 3 years. Due 2021  3) Contraception - Refill of Junel 1/20 e-prescribed. Discussed getting an FSH/LH next year after she is off the pills x 1 month  4) Routine healthcare maintenance including cholesterol, diabetes screening  done at work   5) Large fibroid-declines follow up fibroid at this time  6) Colon cancer screen: Explained options: annual stool check for occult blood, colonoscopy, Cologuard. Desires Cologuard. Order sent  7) RTO 1 year and prn  Dalia Heading, CNM

## 2019-05-08 ENCOUNTER — Telehealth: Payer: Self-pay

## 2019-05-08 NOTE — Telephone Encounter (Signed)
Substitution OK. I called pharmacy. Dalia Heading, CNM

## 2019-05-08 NOTE — Telephone Encounter (Signed)
Caitlyn from [facility] in [place] calling.  Junel FE is not currently available.  Rx was DAW.  Can generic be substituted?  [phone]

## 2019-05-18 ENCOUNTER — Ambulatory Visit
Admission: RE | Admit: 2019-05-18 | Discharge: 2019-05-18 | Disposition: A | Discharge status: Home / Self Care | Payer: Managed Care, Other (non HMO) | Source: Ambulatory Visit | Attending: Certified Nurse Midwife | Admitting: Certified Nurse Midwife

## 2019-05-18 ENCOUNTER — Other Ambulatory Visit: Payer: Self-pay

## 2019-05-18 DIAGNOSIS — Z1239 Encounter for other screening for malignant neoplasm of breast: Secondary | ICD-10-CM

## 2019-05-18 DIAGNOSIS — Z1231 Encounter for screening mammogram for malignant neoplasm of breast: Secondary | ICD-10-CM | POA: Insufficient documentation

## 2020-02-17 ENCOUNTER — Ambulatory Visit: Payer: Managed Care, Other (non HMO) | Attending: Internal Medicine

## 2020-02-17 DIAGNOSIS — Z23 Encounter for immunization: Secondary | ICD-10-CM

## 2020-02-17 NOTE — Progress Notes (Signed)
   Covid-19 Vaccination Clinic  Name:  Erin Davidson    MRN: [id number] DOB: 04/06/1966  02/17/2020  [name] was observed post Covid-19 immunization for 15 minutes without incident. She was provided with Vaccine Information Sheet and instruction to access the V-Safe system.   [name] was instructed to call 911 with any severe reactions post vaccine: Marland Kitchen Difficulty breathing  . Swelling of face and throat  . A fast heartbeat  . A bad rash all over body  . Dizziness and weakness   Immunizations Administered    Name Date Dose VIS Date Route   Pfizer COVID-19 Vaccine 02/17/2020 12:28 PM 0.3 mL 11/10/2019 Intramuscular   Manufacturer: [place]   Lot: [id number]   [place]: [id number]

## 2020-03-13 ENCOUNTER — Ambulatory Visit: Payer: Managed Care, Other (non HMO)

## 2020-03-13 ENCOUNTER — Ambulatory Visit: Payer: Managed Care, Other (non HMO) | Attending: Internal Medicine

## 2020-03-13 DIAGNOSIS — Z23 Encounter for immunization: Secondary | ICD-10-CM

## 2020-03-13 NOTE — Progress Notes (Signed)
   Covid-19 Vaccination Clinic  Name:  Erin Davidson    MRN: [id number] DOB: 1965-12-25  03/13/2020  [name] was observed post Covid-19 immunization for 15 minutes without incident. She was provided with Vaccine Information Sheet and instruction to access the V-Safe system.   [name] was instructed to call 911 with any severe reactions post vaccine: Marland Kitchen Difficulty breathing  . Swelling of face and throat  . A fast heartbeat  . A bad rash all over body  . Dizziness and weakness   Immunizations Administered    Name Date Dose VIS Date Route   Pfizer COVID-19 Vaccine 03/13/2020  3:31 PM 0.3 mL 11/10/2019 Intramuscular   Manufacturer: [employer], [employer]   Lot: [id number]   [place]: [id number]

## 2020-04-13 ENCOUNTER — Other Ambulatory Visit: Payer: Self-pay | Admitting: Certified Nurse Midwife

## 2020-05-02 NOTE — Progress Notes (Signed)
Gynecology Annual Exam  PCP: Patient, No Pcp Per  Chief Complaint:  Chief Complaint  Patient presents with  . Gynecologic Exam  . [employer] Employee    History of Present Illness: Pinkie Pool is a 54 y.o. [id number] BF, LMP 04/25/2019, who presents for an annual exam. The patient states that her withdrawal bleeds on the Junel 1/20 are monthly, mostly spotting and last 3-4 days.  Will also experience some bloating and pressure with her menses.  Denies hot flashes or dysmenorrhea.  Does have monthly breakthrough spotting x 1-3 days.   Her past medical history is remarkable for a large fibroid uterus and Von Willebrand disease. She has had two ectopic pregnancies and has had a partial left salpingectmy and a left oophorectomy. She also has a remote history of abnormal Pap smears and has had a LEEP in 1996. Her last Pap smear in 03/17/2017 was NIL. The patient is sexually active. She currently uses Oral contraceptives: 1/20 for contraception.   The patient does perform occasional self breast exams. She has a history of fibrocystic breasts and her last mammogram was 05/18/2019.  It was negative. There is no notable family history of breast or ovarian cancer in her family. The patient does not smoke. She drinks wine 2x/week and 2-3 beer on the weekends.. She denies use of illicit street drugs. The patient has regular exercise: yes, she walks 48mile at work on most days   She may not get adequate calcium in her diet. Is taking a vitamin D3 supplement. Her routine screen for cholesterol was normal in 2019 at work ([employer]). She is having another lab panel drawn this year. She reports collecting her Cologuard last year, but it was never picked up from her house.   Review of Systems: Review of Systems  Constitutional: Negative for chills, fever and weight loss.  HENT: Negative for congestion, sinus pain and sore throat.   Eyes: Negative for blurred vision and pain.  Respiratory: Negative for  hemoptysis, shortness of breath and wheezing.   Cardiovascular: Negative for chest pain, palpitations and leg swelling.  Gastrointestinal: Negative for abdominal pain, blood in stool, diarrhea, heartburn, nausea and vomiting.  Genitourinary: Negative for dysuria, frequency, hematuria and urgency.       Positive for BTB  Musculoskeletal: Negative for back pain, joint pain and myalgias.  Skin: Negative for itching and rash.  Neurological: Negative for dizziness, tingling and headaches.  Endo/Heme/Allergies: Negative for environmental allergies and polydipsia. Does not bruise/bleed easily.       Negative for hirsutism   Psychiatric/Behavioral: Negative for depression. The patient is not nervous/anxious and does not have insomnia.   Breasts: no masses or tenderness  Past Medical History:  Past Medical History:  Diagnosis Date  . Ectopic pregnancy 05/20/92; 02/25/99  . Fibrocystic breast changes of both breasts   . Fibroid   . History of abnormal cervical Pap smear    HGSIL  . Von Willebrand disease ([place])     Past Surgical History:  Past Surgical History:  Procedure Laterality Date  . CERVICAL BIOPSY  W/ LOOP ELECTRODE EXCISION  01/24/1995   HSIL  . COLPOSCOPY    . OOPHORECTOMY  02/25/1999   left ovary/tube preop ruptured ectopic  . OVARIAN CYST REMOVAL Left   . SALPINGECTOMY  05/20/1992   left for partial ruptured ectopic    Gynecologic History:  Patient's last menstrual period was 04/21/2020 (exact date).  Family History:  Family History  Problem Relation Age of Onset  .  Hyperlipidemia Mother   . Hyperlipidemia Maternal Aunt        all four maternal aunts  . Lung cancer Maternal Grandfather   . Lung cancer Paternal Grandfather   . Kidney cancer Maternal Grandmother 55  . Diabetes Neg Hx   . Heart disease Neg Hx   . Hypertension Neg Hx     Social History:  Social History   Socioeconomic History  . Marital status: Divorced    Spouse name: Not on file  . Number of  children: 2  . Years of education: Not on file  . Highest education level: Not on file  Occupational History  . Occupation: Lab asisstant  Tobacco Use  . Smoking status: Never Smoker  . Smokeless tobacco: Never Used  Substance and Sexual Activity  . Alcohol use: Yes    Comment: occ  . Drug use: No  . Sexual activity: Yes    Birth control/protection: Pill  Other Topics Concern  . Not on file  Social History Narrative  . Not on file   Social Determinants of Health   Financial Resource Strain:   . Difficulty of Paying Living Expenses:   Food Insecurity:   . Worried About Charity fundraiser in the Last Year:   . Arboriculturist in the Last Year:   Transportation Needs:   . Film/video editor (Medical):   Marland Kitchen Lack of Transportation (Non-Medical):   Physical Activity:   . Days of Exercise per Week:   . Minutes of Exercise per Session:   Stress:   . Feeling of Stress :   Social Connections:   . Frequency of Communication with Friends and Family:   . Frequency of Social Gatherings with Friends and Family:   . Attends Religious Services:   . Active Member of Clubs or Organizations:   . Attends Archivist Meetings:   Marland Kitchen Marital Status:   Intimate Partner Violence:   . Fear of Current or Ex-Partner:   . Emotionally Abused:   Marland Kitchen Physically Abused:   . Sexually Abused:     Allergies:  Allergies  Allergen Reactions  . Aspirin     See medical hx    Medications:  Current Outpatient Medications:  Marland Kitchen  Vitamin D3 1000 IU daily     Vitamin C 1000 mgm daily .  norethindrone-ethinyl estradiol (AUROVELA FE 1/20) 1-20 MG-MCG tablet, Take 1 tablet by mouth daily., Disp: 84 tablet, Rfl: 3   Physical Exam Vitals: BP 120/70   Ht 5\' 5"  (1.651 m)   Wt 154 lb (69.9 kg)   LMP 04/21/2020 (Exact Date)   BMI 25.63 kg/m   General: well nourished, well developed, BF, in NAD HEENT: normocephalic, anicteric Thyroid: no enlargement, no palpable nodules Pulmonary: No  increased work of breathing, CTAB Cardiovascular: RRR,without murmur Breast: Breast symmetrical, no tenderness, no skin or nipple retraction present, no masses, no nipple discharge. No axillary, infraclavicular, or supraclavicular lymphadenopathy. Abdomen:  soft, non-tender. Mass present (uterine fundus one FB above the umbilicus)   No hepatomegaly.  No evidence of hernia  Genitourinary:  External: Normal external female genitalia.  Normal urethral meatus, normal Bartholin's and Skene's glands.    Vagina: Normal vaginal mucosa, no evidence of prolapse.    Cervix: extremely posterior, no bleeding  Uterus: enlarged, 20+ week size,  not mobile,  NT  Adnexa: unable to evaluate due to large uterus  Rectal: deferred  Lymphatic: no evidence of inguinal lymphadenopathy Extremities: no edema, erythema, or tenderness Neurologic:  Grossly intact Psychiatric: mood appropriate, affect full     Assessment: 54 y.o. [id number] well woman exam BTB on OCPs-may be due to atrophic endometrium on pills.  Uterine fibroid was not encroaching into endometrium on last ultrasound  Will try adding estradiol 1 mgm daily x 2 weeks when starting the next 2 packs of OCPs. Large fibroid uterus ?slightly larger  Pelvic ultrasound to check for growth of fibroids  Follow up with MD to discuss nonsurgical treatment of fibroids. Has von Willebrand disease.     Plan: 1)Breast cancer screening: screening mammogram ordered. Patient to schedule at [facility] after 05/18/2019  2) Cervical cancer screening: Pap done. Desires Pap smears every 3 years.  3) Contraception - Refill of Aurovela 1/20 e-prescribed.   4) Routine healthcare maintenance including cholesterol, diabetes screening  done at work   5) Colon cancer screen:  Cologuard reordered. Given Cologuard customer Service phone number in case of any problems mailing back specimen   Dalia Heading, CNM

## 2020-05-03 ENCOUNTER — Ambulatory Visit (INDEPENDENT_AMBULATORY_CARE_PROVIDER_SITE_OTHER): Payer: Managed Care, Other (non HMO) | Admitting: Certified Nurse Midwife

## 2020-05-03 ENCOUNTER — Other Ambulatory Visit: Payer: Self-pay

## 2020-05-03 ENCOUNTER — Encounter: Payer: Self-pay | Admitting: Certified Nurse Midwife

## 2020-05-03 VITALS — BP 120/70 | Ht 65.0 in | Wt 154.0 lb

## 2020-05-03 DIAGNOSIS — Z1231 Encounter for screening mammogram for malignant neoplasm of breast: Secondary | ICD-10-CM | POA: Diagnosis not present

## 2020-05-03 DIAGNOSIS — D252 Subserosal leiomyoma of uterus: Secondary | ICD-10-CM

## 2020-05-03 DIAGNOSIS — Z124 Encounter for screening for malignant neoplasm of cervix: Secondary | ICD-10-CM

## 2020-05-03 DIAGNOSIS — Z1211 Encounter for screening for malignant neoplasm of colon: Secondary | ICD-10-CM

## 2020-05-03 DIAGNOSIS — D251 Intramural leiomyoma of uterus: Secondary | ICD-10-CM | POA: Diagnosis not present

## 2020-05-03 MED ORDER — ESTRADIOL 1 MG PO TABS: ORAL_TABLET | ORAL | 0 refills | Status: DC

## 2020-05-03 MED ORDER — NORETHIN ACE-ETH ESTRAD-FE 1-20 MG-MCG PO TABS: 1.0000 | ORAL_TABLET | Freq: Every day | ORAL | 3 refills | Status: DC

## 2020-05-03 NOTE — Patient Instructions (Signed)

## 2020-05-04 ENCOUNTER — Other Ambulatory Visit: Payer: Self-pay | Admitting: Certified Nurse Midwife

## 2020-05-07 LAB — IGP, APTIMA HPV: HPV Aptima: NEGATIVE

## 2020-05-08 ENCOUNTER — Encounter: Payer: Self-pay | Admitting: Certified Nurse Midwife

## 2020-05-14 ENCOUNTER — Ambulatory Visit: Payer: Managed Care, Other (non HMO) | Admitting: Certified Nurse Midwife

## 2020-05-14 ENCOUNTER — Other Ambulatory Visit: Payer: Managed Care, Other (non HMO)

## 2020-05-16 ENCOUNTER — Ambulatory Visit (INDEPENDENT_AMBULATORY_CARE_PROVIDER_SITE_OTHER): Payer: Managed Care, Other (non HMO) | Admitting: Obstetrics and Gynecology

## 2020-05-16 ENCOUNTER — Ambulatory Visit (INDEPENDENT_AMBULATORY_CARE_PROVIDER_SITE_OTHER): Payer: Managed Care, Other (non HMO)

## 2020-05-16 ENCOUNTER — Other Ambulatory Visit: Payer: Self-pay

## 2020-05-16 ENCOUNTER — Other Ambulatory Visit: Payer: Self-pay | Admitting: Certified Nurse Midwife

## 2020-05-16 VITALS — BP 132/86 | Ht 65.0 in | Wt 155.0 lb

## 2020-05-16 DIAGNOSIS — D251 Intramural leiomyoma of uterus: Secondary | ICD-10-CM | POA: Diagnosis not present

## 2020-05-16 DIAGNOSIS — D252 Subserosal leiomyoma of uterus: Secondary | ICD-10-CM

## 2020-05-16 NOTE — Progress Notes (Signed)
Obstetrics & Gynecology Office Visit   Chief Complaint  Patient presents with  . Follow-up  Ultrasound follow up for history of fibroid uterus  History of Present Illness: 54 y.o. [id number] female who presents in follow up of a pelvic ultrasound.    Ultrasound demonstrates the following findings Adnexa: right ovary is not visible, left ovary not present with history of oophorectomy Uterus: anteverted with endometrial stripe  Not visible Additional:  Fibroid 1: 11.7 x 10.4 x 8 cm posterior subserosal Fibroid 2: 4.0 x 2.7 x 2.7 cm right subserosal  Compared to ultrasound on 04/08/2018: Fibroid 1: 10 x 11 cm posterior subserosal vs intramural  She continues to take combined oral contraceptive pills and has intermittent spotting occasionally.  Essentially, her biggest issue is the fact that the fibroid is large and is noticeable through her abdominal wall.  She notably has a history of von Willebrand's disease although she does not have sequela indicative of severe disease.  She presents today to discuss nonsurgical management options.  She recently had a normal Pap smear.  Past Medical History:  Diagnosis Date  . Ectopic pregnancy 05/20/92; 02/25/99  . Fibrocystic breast changes of both breasts   . Fibroid   . History of abnormal cervical Pap smear    HGSIL  . Von Willebrand disease ([place])     Past Surgical History:  Procedure Laterality Date  . CERVICAL BIOPSY  W/ LOOP ELECTRODE EXCISION  01/24/1995   HSIL  . COLPOSCOPY    . OOPHORECTOMY  02/25/1999   left ovary/tube preop ruptured ectopic  . OVARIAN CYST REMOVAL Left   . SALPINGECTOMY  05/20/1992   left for partial ruptured ectopic    Gynecologic History: Patient's last menstrual period was 04/21/2020 (exact date).  Obstetric History: [id number]  Family History  Problem Relation Age of Onset  . Hyperlipidemia Mother   . Hyperlipidemia Maternal Aunt        all four maternal aunts  . Lung cancer Maternal Grandfather   .  Lung cancer Paternal Grandfather   . Kidney cancer Maternal Grandmother 4  . Diabetes Neg Hx   . Heart disease Neg Hx   . Hypertension Neg Hx     Social History   Socioeconomic History  . Marital status: Divorced    Spouse name: Not on file  . Number of children: 2  . Years of education: Not on file  . Highest education level: Not on file  Occupational History  . Occupation: Lab asisstant  Tobacco Use  . Smoking status: Never Smoker  . Smokeless tobacco: Never Used  Vaping Use  . Vaping Use: Never used  Substance and Sexual Activity  . Alcohol use: Yes    Comment: occ  . Drug use: No  . Sexual activity: Yes    Birth control/protection: Pill  Other Topics Concern  . Not on file  Social History Narrative  . Not on file   Social Determinants of Health   Financial Resource Strain:   . Difficulty of Paying Living Expenses:   Food Insecurity:   . Worried About Charity fundraiser in the Last Year:   . Arboriculturist in the Last Year:   Transportation Needs:   . Film/video editor (Medical):   Marland Kitchen Lack of Transportation (Non-Medical):   Physical Activity:   . Days of Exercise per Week:   . Minutes of Exercise per Session:   Stress:   . Feeling of Stress :   Social Connections:   .  Frequency of Communication with Friends and Family:   . Frequency of Social Gatherings with Friends and Family:   . Attends Religious Services:   . Active Member of Clubs or Organizations:   . Attends Archivist Meetings:   Marland Kitchen Marital Status:   Intimate Partner Violence:   . Fear of Current or Ex-Partner:   . Emotionally Abused:   Marland Kitchen Physically Abused:   . Sexually Abused:     Allergies  Allergen Reactions  . Aspirin     See medical hx    Prior to Admission medications   Medication Sig Start Date End Date Taking? Authorizing Provider  estradiol (ESTRACE) 1 MG tablet TAke one tablet x 14 days at the start of the next two packs of pills. 05/03/20   Dalia Heading,  CNM  Multiple Vitamins-Calcium (ONE-A-DAY WOMENS FORMULA PO) Take 1 tablet by mouth daily.    [provider]  norethindrone-ethinyl estradiol (AUROVELA FE 1/20) 1-20 MG-MCG tablet Take 1 tablet by mouth daily. 05/03/20   Dalia Heading, CNM    Review of Systems  Constitutional: Negative.   HENT: Negative.   Eyes: Negative.   Respiratory: Negative.   Cardiovascular: Negative.   Gastrointestinal: Negative.   Genitourinary: Negative.   Musculoskeletal: Negative.   Skin: Negative.   Neurological: Negative.   Psychiatric/Behavioral: Negative.      Physical Exam BP 132/86   Ht 5\' 5"  (1.651 m)   Wt 155 lb (70.3 kg)   LMP 04/21/2020 (Exact Date)   BMI 25.79 kg/m  Patient's last menstrual period was 04/21/2020 (exact date). Physical Exam Constitutional:      General: She is not in acute distress.    Appearance: Normal appearance.  HENT:     Head: Normocephalic and atraumatic.  Eyes:     General: No scleral icterus.    Conjunctiva/sclera: Conjunctivae normal.  Neurological:     General: No focal deficit present.     Mental Status: She is alert and oriented to person, place, and time.     Cranial Nerves: No cranial nerve deficit.  Psychiatric:        Mood and Affect: Mood normal.        Behavior: Behavior normal.        Judgment: Judgment normal.    Female chaperone present for pelvic and breast  portions of the physical exam  Imaging Results:  US PELVIC COMPLETE WITH TRANSVAGINAL  Result Date: 05/16/2020 Patient Name: Erin Davidson DOB: 1966/04/20 MRN: [id number] ULTRASOUND REPORT Location: [facility] Date of Service: 05/16/2020 Indications:Enlarged Uterus Findings: The uterus is anteverted and measures 17.1 x 12.7 x 9.3 cm. The uterine fundus is approximately half an inch above the umbilicus. Echo texture is heterogenous with evidence of focal masses. Within the uterus are multiple suspected fibroids measuring: Fibroid 1: 11.7 x 10.4 x 8.0 cm subserosal  posterior Fibroid 2: 4.0 x 2.67 x 2.69 cm subserosal right The Endometrium is not visualized. Right Ovary is not visible. Left Ovary oophorectomy. Survey of the adnexa demonstrates no adnexal masses. There is no free fluid in the cul de sac. Impression: 1. There are at least two uterine fibroids. The largest is essentially stable as compared to her last ultrasound in 2019.  The second fibroid could represent a new fibroid or was not appreciated on last imaging. 2. The uterine fundus is above the umbilicus. 3. The right ovary is not visible. 4. Left oophorectomy. Gweneth Dimitri, RT The ultrasound images and findings were reviewed by me  and I agree with the above report. Prentice Docker, MD, [facility], [facility] 05/16/2020 12:18 PM     Assessment: 54 y.o. [id number] female here for  1. Intramural and subserous leiomyoma of uterus      Plan: Problem List Items Addressed This Visit      Genitourinary   Uterine fibroid - Primary     While she appears to have a mild version of von Willebrand's disease and does not require treatment, her symptoms appear to be minimal.  We discussed nonsurgical options, which include; 1) do nothing at this time and await for spontaneous menopause to occur.  This may be difficult to appreciate as she is taking a combined hormonal contraceptive pill.  At some point stopping this medication to determine where she is in the menopausal transition will be necessary.  Consideration for lowering her dose to hormone replacement therapy dosing is also a possibility.  We discussed that when she achieves menopausal status her fibroid should be able to dissipate in size somewhat.  However, the fibroid will likely still be somewhat large even when there is no hormonal contribution.  2) we discussed medication therapy with Depo leuprolide acetate and Orilissa.  These medications effectively shut down the hypothalamic pituitary ovarian axis and starve of the fibroid  of hormones.  The side effect of this medication would be that she would directly go into a menopausal state.  Add back therapy could be initiated.  However, this therapy tends to be a high dose of progesterone to which most fibroids are sensitive and growth of the fibroids may occur.  3) procedural options could include newer therapies to reduce the size of fibroids using ultrasound frequency waves to diminish the size of the fibroid, uterine fibroid embolization (this may not be a good option given her von Willebrand's disease status), hysterectomy or myomectomy.  All options where the skin is broken may not be ideal in this patient with von Willebrand's disease even though her disease activity appears to be low. With all of this information the patient would like to consider these options and she will let me know if she decides she would like to choose any of the medication or interventional options.  All questions answered at this time.  A total of 30 minutes were spent face-to-face with the patient as well as preparation, review, communication, and documentation during this encounter.    Prentice Docker, MD 05/16/2020 5:50 PM

## 2020-05-18 ENCOUNTER — Encounter: Payer: Self-pay | Admitting: Obstetrics and Gynecology

## 2020-07-01 ENCOUNTER — Other Ambulatory Visit: Payer: Self-pay | Admitting: Certified Nurse Midwife

## 2020-07-02 NOTE — Telephone Encounter (Signed)
Pt calling; [facility] tells her there is no refill of her bc; doesn't know if [facility] has taken her off of it or not.  Please let her know what's going on.  [phone]  Pt aware she has refills of BC; it's the estradiol that she doesn't have ref on b/c she was to only take it with the next 2 pks of bcp.  Pt to call pharm and have the get bcp ready for p/u.

## 2020-07-08 ENCOUNTER — Ambulatory Visit
Admission: RE | Admit: 2020-07-08 | Discharge: 2020-07-08 | Disposition: A | Discharge status: Home / Self Care | Payer: Managed Care, Other (non HMO) | Source: Ambulatory Visit | Attending: Certified Nurse Midwife | Admitting: Certified Nurse Midwife

## 2020-07-08 ENCOUNTER — Other Ambulatory Visit: Payer: Self-pay

## 2020-07-08 DIAGNOSIS — Z1231 Encounter for screening mammogram for malignant neoplasm of breast: Secondary | ICD-10-CM | POA: Diagnosis not present

## 2020-07-11 ENCOUNTER — Telehealth: Payer: Self-pay

## 2020-07-11 NOTE — Telephone Encounter (Signed)
Manufacturing engineer recv'd stating they have made several attempt to encourage pt to return their sample.  They was [place] to contact pt.  Tried mobile # [phone]  "all circuits are busy; please call again later"

## 2020-08-02 NOTE — Telephone Encounter (Signed)
Pt aware to send in [place].  States she keeps putting it off but she will do it.

## 2020-12-27 ENCOUNTER — Telehealth: Payer: Self-pay

## 2020-12-27 NOTE — Telephone Encounter (Signed)
Finally I was able to get through pharmacy! Spoke to rep Raquel, was told local [facility] is busy and their calls are being transferred to customer service dept. Says she is showing Rx with 2 RF's. Will go ahead and put in system to get Rx RF ready for pt to pick up. Per rep, pt should be able to pick up 90 mins after hanging up with rep. Pt aware.

## 2020-12-27 NOTE — Telephone Encounter (Signed)
Pt left msg on triage saying pharmacy is telling her she has no refills on her bc, if we can pls call them. I have called 3 times and been on hold between 10-15 mins and cannot get anyone to answer phone. I called pt to let her know that I have not been able to get through pharmacy and that I will keep trying to clarify this. CB#:[phone]

## 2020-12-27 NOTE — Telephone Encounter (Signed)
This encounter was created in error - please disregard.

## 2021-02-28 HISTORY — PX: MOUTH SURGERY: SHX715

## 2021-03-10 ENCOUNTER — Other Ambulatory Visit: Payer: Self-pay

## 2021-03-10 MED ORDER — NORETHIN ACE-ETH ESTRAD-FE 1-20 MG-MCG PO TABS: 1.0000 | ORAL_TABLET | Freq: Every day | ORAL | 0 refills | Status: DC

## 2021-03-10 NOTE — Telephone Encounter (Signed)
Pt calling; has annual scheduled for 05/09/21 needs ref of bc until them.  [phone]  Pt aware refill eRx'd.

## 2021-05-09 ENCOUNTER — Ambulatory Visit (INDEPENDENT_AMBULATORY_CARE_PROVIDER_SITE_OTHER): Payer: Managed Care, Other (non HMO) | Admitting: Obstetrics and Gynecology

## 2021-05-09 ENCOUNTER — Other Ambulatory Visit: Payer: Self-pay

## 2021-05-09 ENCOUNTER — Encounter: Payer: Self-pay | Admitting: Obstetrics and Gynecology

## 2021-05-09 VITALS — BP 130/80 | Ht 65.0 in | Wt 153.0 lb

## 2021-05-09 DIAGNOSIS — D251 Intramural leiomyoma of uterus: Secondary | ICD-10-CM | POA: Diagnosis not present

## 2021-05-09 DIAGNOSIS — Z01419 Encounter for gynecological examination (general) (routine) without abnormal findings: Secondary | ICD-10-CM | POA: Diagnosis not present

## 2021-05-09 DIAGNOSIS — Z1331 Encounter for screening for depression: Secondary | ICD-10-CM | POA: Diagnosis not present

## 2021-05-09 DIAGNOSIS — Z1339 Encounter for screening examination for other mental health and behavioral disorders: Secondary | ICD-10-CM

## 2021-05-09 DIAGNOSIS — Z1211 Encounter for screening for malignant neoplasm of colon: Secondary | ICD-10-CM

## 2021-05-09 DIAGNOSIS — D252 Subserosal leiomyoma of uterus: Secondary | ICD-10-CM

## 2021-05-09 MED ORDER — NORETHIN ACE-ETH ESTRAD-FE 1-20 MG-MCG PO TABS: 1.0000 | ORAL_TABLET | Freq: Every day | ORAL | 1 refills | Status: AC

## 2021-05-09 NOTE — Progress Notes (Signed)
Routine Annual Gynecology Examination   PCP: Patient, No Pcp Per (Inactive)   Chief Complaint  Patient presents with   Annual Exam   History of Present Illness: Patient is a 55 y.o. [id number] presents for annual exam. The patient has no complaints today.   She is still menstruating.  She has had lighter and shorter periods.   Menopausal symptoms: denies  Breast symptoms: denies  Last pap smear: 05/03/2020.  Result Normal  Last mammogram: 10 monthsago.  Result Normal  Last Colonoscopy: has tried Cologard twice and she states that no one ever comes to pick up the specimen.  She feels like her fibroid is getting smaller.   Past Medical History:  Diagnosis Date   Ectopic pregnancy 05/20/92; 02/25/99   Fibrocystic breast changes of both breasts    Fibroid    History of abnormal cervical Pap smear    HGSIL   Von Willebrand disease ([place])     Past Surgical History:  Procedure Laterality Date   CERVICAL BIOPSY  W/ LOOP ELECTRODE EXCISION  01/24/1995   HSIL   COLPOSCOPY     MOUTH SURGERY  02/2021   OOPHORECTOMY  02/25/1999   left ovary/tube preop ruptured ectopic   OVARIAN CYST REMOVAL Left    SALPINGECTOMY  05/20/1992   left for partial ruptured ectopic    Prior to Admission medications   Medication Sig Start Date End Date Taking? Authorizing Provider  Multiple Vitamins-Calcium (ONE-A-DAY WOMENS FORMULA PO) Take 1 tablet by mouth daily.   Yes [provider]  norethindrone-ethinyl estradiol (AUROVELA FE 1/20) 1-20 MG-MCG tablet Take 1 tablet by mouth daily. 03/10/21  Yes Malachy Mood, MD    Allergies  Allergen Reactions   Aspirin     See medical hx    Social History   Socioeconomic History   Marital status: Divorced    Spouse name: Not on file   Number of children: 2   Years of education: Not on file   Highest education level: Not on file  Occupational History   Occupation: Lab asisstant  Tobacco Use   Smoking status: Never   Smokeless  tobacco: Never  Vaping Use   Vaping Use: Never used  Substance and Sexual Activity   Alcohol use: Yes    Comment: occ   Drug use: No   Sexual activity: Yes    Birth control/protection: Pill  Other Topics Concern   Not on file  Social History Narrative   Not on file   Social Determinants of Health   Financial Resource Strain: Not on file  Food Insecurity: Not on file  Transportation Needs: Not on file  Physical Activity: Not on file  Stress: Not on file  Social Connections: Not on file  Intimate Partner Violence: Not on file    Family History  Problem Relation Age of Onset   Hyperlipidemia Mother    Hyperlipidemia Maternal Aunt        all four maternal aunts   Lung cancer Maternal Grandfather    Lung cancer Paternal Grandfather    Kidney cancer Maternal Grandmother 70   Diabetes Neg Hx    Heart disease Neg Hx    Hypertension Neg Hx     Review of Systems  Constitutional: Negative.   HENT: Negative.    Eyes: Negative.   Respiratory: Negative.    Cardiovascular: Negative.   Gastrointestinal: Negative.   Genitourinary: Negative.   Musculoskeletal: Negative.   Skin: Negative.   Neurological: Negative.   Psychiatric/Behavioral: Negative.  Physical Exam Vitals: BP 130/80   Ht 5\' 5"  (1.651 m)   Wt 153 lb (69.4 kg)   BMI 25.46 kg/m   Physical Exam Constitutional:      General: She is not in acute distress.    Appearance: Normal appearance. She is well-developed.  Genitourinary:     Vulva and bladder normal.     Right Labia: No rash, tenderness, lesions, skin changes or Bartholin's cyst.    Left Labia: No tenderness, lesions, skin changes, Bartholin's cyst or rash.    No inguinal adenopathy present in the right or left side.    Pelvic Tanner Score: 5/5.    No vaginal discharge, erythema, tenderness or bleeding.      Right Adnexa: not tender, not full and no mass present.    Left Adnexa: not tender, not full and no mass present.    No cervical motion  tenderness, discharge, lesion or polyp.     Uterus is enlarged.     Uterus is not fixed or tender.     No uterine mass detected.    Pelvic exam was performed with patient in the lithotomy position.  Breasts:    Right: No inverted nipple, mass, nipple discharge, skin change or tenderness.     Left: No inverted nipple, mass, nipple discharge, skin change or tenderness.  HENT:     Head: Normocephalic and atraumatic.     Left Ear: There is no impacted cerumen.  Eyes:     General: No scleral icterus.    Conjunctiva/sclera: Conjunctivae normal.  Neck:     Thyroid: No thyromegaly.  Cardiovascular:     Rate and Rhythm: Normal rate and regular rhythm.     Heart sounds: No murmur heard.   No friction rub. No gallop.  Pulmonary:     Effort: Pulmonary effort is normal. No respiratory distress.     Breath sounds: Normal breath sounds. No wheezing or rales.  Abdominal:     General: Bowel sounds are normal. There is no distension.     Palpations: Abdomen is soft. There is mass (mass extending from pelvis to around the umbilicus, firm, mobile, extending 5-6 cm laterally).     Tenderness: There is no abdominal tenderness. There is no guarding or rebound.     Hernia: There is no hernia in the left inguinal area or right inguinal area.  Musculoskeletal:        General: No swelling or tenderness. Normal range of motion.     Cervical back: Normal range of motion and neck supple.  Lymphadenopathy:     Cervical: No cervical adenopathy.     Lower Body: No right inguinal adenopathy. No left inguinal adenopathy.  Neurological:     General: No focal deficit present.     Mental Status: She is alert and oriented to person, place, and time.     Cranial Nerves: No cranial nerve deficit.  Skin:    General: Skin is warm and dry.     Findings: No erythema or rash.  Psychiatric:        Mood and Affect: Mood normal.        Behavior: Behavior normal.        Judgment: Judgment normal.     Female chaperone  present for pelvic and breast  portions of the physical exam  Results: AUDIT Questionnaire (screen for alcoholism): 7 PHQ-9: 0   Assessment and Plan:  55 y.o. [id number] female here for routine annual gynecologic examination  Plan: Problem List Items  Addressed This Visit       Genitourinary   Uterine fibroid   Relevant Medications   norethindrone-ethinyl estradiol-FE (AUROVELA FE 1/20) 1-20 MG-MCG tablet   Other Visit Diagnoses     Women's annual routine gynecological examination    -  Primary   Screening for depression       Screening for alcoholism       Screen for colon cancer       Relevant Orders   Cologuard       Screening: -- Blood pressure screen normal -- Colonoscopy -  due. Will try Cologard one more time. If this doesn't work, will consider colonoscopy -- Mammogram -  due in August. Patient to schedule. -- Weight screening: normal -- Depression screening negative (PHQ-9) -- Nutrition: normal -- cholesterol screening: not due for screening -- osteoporosis screening: not due -- tobacco screening: not using -- alcohol screening: AUDIT questionnaire indicates low-risk usage. -- family history of breast cancer screening: done. not at high risk. -- no evidence of domestic violence or intimate partner violence. -- STD screening: gonorrhea/chlamydia NAAT not collected per patient request. -- pap smear not collected per [facility]P guidelines  Fibroid uterus: continue COCs for 6 months then come off to see how she does to determine menopausal status.    Prentice Docker, MD 05/09/2021 4:24 PM

## 2021-06-18 ENCOUNTER — Telehealth: Payer: Self-pay

## 2021-06-18 NOTE — Telephone Encounter (Signed)
Pt calling; has UTI; appt or go to UC.  [phone]   Adv either; appt made for drop off tomorrow.

## 2021-06-19 ENCOUNTER — Other Ambulatory Visit: Payer: Self-pay | Admitting: Obstetrics and Gynecology

## 2021-06-19 ENCOUNTER — Other Ambulatory Visit: Payer: Self-pay

## 2021-06-19 ENCOUNTER — Ambulatory Visit (INDEPENDENT_AMBULATORY_CARE_PROVIDER_SITE_OTHER): Payer: Managed Care, Other (non HMO)

## 2021-06-19 DIAGNOSIS — N3000 Acute cystitis without hematuria: Secondary | ICD-10-CM

## 2021-06-19 DIAGNOSIS — R1030 Lower abdominal pain, unspecified: Secondary | ICD-10-CM | POA: Diagnosis not present

## 2021-06-19 DIAGNOSIS — R102 Pelvic and perineal pain: Secondary | ICD-10-CM

## 2021-06-19 LAB — POCT URINALYSIS DIPSTICK
Bilirubin, UA: NEGATIVE
Glucose, UA: NEGATIVE
Ketones, UA: NEGATIVE
Nitrite, UA: NEGATIVE
Protein, UA: NEGATIVE
Spec Grav, UA: >=1.03 — AB (ref 1.010–1.025)
Urobilinogen, UA: 0.2 E.U./dL
pH, UA: 5 (ref 5.0–8.0)

## 2021-06-19 MED ORDER — SULFAMETHOXAZOLE-TRIMETHOPRIM 800-160 MG PO TABS: 1.0000 | ORAL_TABLET | Freq: Two times a day (BID) | ORAL | 0 refills | Status: AC

## 2021-06-19 NOTE — Progress Notes (Addendum)
Pt c/o abd pain, pressure in pelvic area, vagina feels dry; no itching/burning.  Pt aware urine sent for culture and [facility] sending rx.

## 2021-06-21 LAB — URINE CULTURE

## 2021-08-19 ENCOUNTER — Other Ambulatory Visit: Payer: Self-pay | Admitting: Obstetrics and Gynecology

## 2021-08-19 DIAGNOSIS — Z1231 Encounter for screening mammogram for malignant neoplasm of breast: Secondary | ICD-10-CM

## 2021-09-01 ENCOUNTER — Ambulatory Visit
Admission: RE | Admit: 2021-09-01 | Discharge: 2021-09-01 | Disposition: A | Discharge status: Home / Self Care | Payer: Managed Care, Other (non HMO) | Source: Ambulatory Visit | Attending: Obstetrics and Gynecology | Admitting: Obstetrics and Gynecology

## 2021-09-01 ENCOUNTER — Other Ambulatory Visit: Payer: Self-pay

## 2021-09-01 DIAGNOSIS — Z1231 Encounter for screening mammogram for malignant neoplasm of breast: Secondary | ICD-10-CM | POA: Diagnosis present

## 2021-10-27 ENCOUNTER — Telehealth: Payer: Self-pay

## 2021-10-27 ENCOUNTER — Other Ambulatory Visit: Payer: Self-pay

## 2021-10-27 ENCOUNTER — Other Ambulatory Visit: Payer: Self-pay | Admitting: Obstetrics and Gynecology

## 2021-10-27 ENCOUNTER — Ambulatory Visit (INDEPENDENT_AMBULATORY_CARE_PROVIDER_SITE_OTHER): Payer: Managed Care, Other (non HMO)

## 2021-10-27 DIAGNOSIS — R300 Dysuria: Secondary | ICD-10-CM

## 2021-10-27 LAB — POCT URINALYSIS DIPSTICK
Appearance: NORMAL
Bilirubin, UA: NEGATIVE
Blood, UA: NEGATIVE
Glucose, UA: NEGATIVE
Ketones, UA: NEGATIVE
Nitrite, UA: NEGATIVE
Odor: NORMAL
Protein, UA: NEGATIVE
Spec Grav, UA: 1.02 (ref 1.010–1.025)
Urobilinogen, UA: 0.2 E.U./dL
pH, UA: 6 (ref 5.0–8.0)

## 2021-10-27 MED ORDER — NITROFURANTOIN MONOHYD MACRO 100 MG PO CAPS: 100.0000 mg | ORAL_CAPSULE | Freq: Two times a day (BID) | ORAL | 0 refills | Status: AC

## 2021-10-27 NOTE — Progress Notes (Signed)
Patient presents today with complaints of burning with urination. Urinalysis performed per protocol. Results recorded (Small Leuks, all others negative). Culture drawn.

## 2021-10-27 NOTE — Telephone Encounter (Signed)
Patient given results Small Leukocytes (not highly suggestive of UTI) per CRS, but rx has been sent. Culture has been sent.

## 2021-10-27 NOTE — Progress Notes (Signed)
Rx for macrbid sent- complaints of dysuria, dropped of urine culture

## 2021-10-27 NOTE — Telephone Encounter (Signed)
Patient inquiring about Urinalysis results from visit earlier today. Cb#[phone]

## 2021-10-28 ENCOUNTER — Ambulatory Visit: Payer: Managed Care, Other (non HMO)

## 2021-10-30 LAB — URINE CULTURE: Organism ID, Bacteria: NO GROWTH

## 2023-09-28 ENCOUNTER — Other Ambulatory Visit: Payer: Self-pay | Admitting: Family Medicine

## 2023-09-28 DIAGNOSIS — Z1231 Encounter for screening mammogram for malignant neoplasm of breast: Secondary | ICD-10-CM

## 2023-10-06 LAB — EXTERNAL GENERIC LAB PROCEDURE: COLOGUARD: NEGATIVE

## 2023-10-13 ENCOUNTER — Ambulatory Visit
Admission: RE | Admit: 2023-10-13 | Discharge: 2023-10-13 | Disposition: A | Discharge status: Home / Self Care | Payer: Managed Care, Other (non HMO) | Source: Ambulatory Visit | Attending: Family Medicine | Admitting: Family Medicine

## 2023-10-13 DIAGNOSIS — Z1231 Encounter for screening mammogram for malignant neoplasm of breast: Secondary | ICD-10-CM | POA: Diagnosis present
